# Patient Record
Sex: Female | Born: 1990 | Race: Black or African American | Hispanic: No | Marital: Single | Smoking: Former smoker
Health system: Southern US, Community
[De-identification: ages and names within clinical notes are randomized; demographics above are authoritative.]

## PROBLEM LIST (undated history)

## (undated) ENCOUNTER — Ambulatory Visit: Admission: EM | Payer: BC Managed Care – PPO

## (undated) DIAGNOSIS — D259 Leiomyoma of uterus, unspecified: Secondary | ICD-10-CM

## (undated) DIAGNOSIS — O139 Gestational [pregnancy-induced] hypertension without significant proteinuria, unspecified trimester: Secondary | ICD-10-CM

## (undated) DIAGNOSIS — K2970 Gastritis, unspecified, without bleeding: Secondary | ICD-10-CM

## (undated) DIAGNOSIS — D649 Anemia, unspecified: Secondary | ICD-10-CM

## (undated) DIAGNOSIS — O3410 Maternal care for benign tumor of corpus uteri, unspecified trimester: Secondary | ICD-10-CM

## (undated) DIAGNOSIS — A048 Other specified bacterial intestinal infections: Secondary | ICD-10-CM

## (undated) DIAGNOSIS — K219 Gastro-esophageal reflux disease without esophagitis: Secondary | ICD-10-CM

## (undated) HISTORY — PX: NO PAST SURGERIES: SHX2092

---
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
## 2008-10-29 DIAGNOSIS — O1490 Unspecified pre-eclampsia, unspecified trimester: Secondary | ICD-10-CM

## 2011-09-14 ENCOUNTER — Emergency Department (HOSPITAL_COMMUNITY)
Admission: EM | Admit: 2011-09-14 | Discharge: 2011-09-14 | Disposition: A | Discharge status: Home / Self Care | Payer: Self-pay | Attending: Emergency Medicine | Admitting: Emergency Medicine

## 2011-09-14 ENCOUNTER — Emergency Department (HOSPITAL_COMMUNITY): Payer: Self-pay

## 2011-09-14 ENCOUNTER — Encounter: Payer: Self-pay | Admitting: Emergency Medicine

## 2011-09-14 DIAGNOSIS — R51 Headache: Secondary | ICD-10-CM | POA: Insufficient documentation

## 2011-09-14 DIAGNOSIS — R509 Fever, unspecified: Secondary | ICD-10-CM | POA: Insufficient documentation

## 2011-09-14 DIAGNOSIS — R05 Cough: Secondary | ICD-10-CM | POA: Insufficient documentation

## 2011-09-14 DIAGNOSIS — J069 Acute upper respiratory infection, unspecified: Secondary | ICD-10-CM | POA: Insufficient documentation

## 2011-09-14 DIAGNOSIS — R059 Cough, unspecified: Secondary | ICD-10-CM | POA: Insufficient documentation

## 2011-09-14 MED ORDER — ACETAMINOPHEN 325 MG PO TABS
ORAL_TABLET | ORAL | Status: AC
  Filled 2011-09-14: qty 2

## 2011-09-14 MED ORDER — BENZONATATE 100 MG PO CAPS: 100.0000 mg | ORAL_CAPSULE | Freq: Three times a day (TID) | ORAL | Status: AC

## 2011-09-14 MED ORDER — ACETAMINOPHEN 325 MG PO TABS
ORAL_TABLET | ORAL | Status: AC
  Filled 2011-09-14: qty 1

## 2011-09-14 MED ORDER — AZITHROMYCIN 250 MG PO TABS: 250.0000 mg | ORAL_TABLET | Freq: Every day | ORAL | Status: AC

## 2011-09-14 MED ORDER — ACETAMINOPHEN 325 MG PO TABS
650.0000 mg | ORAL_TABLET | Freq: Four times a day (QID) | ORAL | Status: DC | PRN
  Administered 2011-09-14: 650 mg via ORAL

## 2011-09-14 NOTE — ED Notes (Signed)
Pt reports cough since yesterday but funny feeling in her chest since Monday. Pt has fever in triage.

## 2011-09-14 NOTE — ED Provider Notes (Signed)
History     CSN: [id number] Arrival date & time: 09/14/2011  3:21 PM   None     Chief Complaint  Patient presents with  . Fever  . Cough    (Consider location/radiation/quality/duration/timing/severity/associated sxs/prior treatment) Patient is a 20 y.o. female presenting with fever and cough. The history is provided by the patient.  Fever Primary symptoms of the febrile illness include fever and cough. Primary symptoms do not include fatigue, visual change, headaches, wheezing, shortness of breath, abdominal pain, nausea, vomiting, diarrhea, dysuria, altered mental status, myalgias, arthralgias or rash. The current episode started 6 to 7 days ago. This is a new problem. The problem has not changed since onset. Cough Pertinent negatives include no headaches, no myalgias, no shortness of breath and no wheezing.   An otherwise healthy female presents with cough, fever, and sinus pressure for 1 week. Denies any SOB, chest pain, neck pain, weakness, urionary symptoms.  History reviewed. No pertinent past medical history.  History reviewed. No pertinent past surgical history.  History reviewed. No pertinent family history.  History  Substance Use Topics  . Smoking status: Current Everyday Smoker  . Smokeless tobacco: Never Used  . Alcohol Use: No    OB History    Grav Para Term Preterm Abortions TAB SAB Ect Mult Living                  Review of Systems  Constitutional: Positive for fever. Negative for fatigue.  Respiratory: Positive for cough. Negative for shortness of breath and wheezing.   Gastrointestinal: Negative for nausea, vomiting, abdominal pain and diarrhea.  Genitourinary: Negative for dysuria.  Musculoskeletal: Negative for myalgias and arthralgias.  Skin: Negative for rash.  Neurological: Negative for headaches.  Psychiatric/Behavioral: Negative for altered mental status.  All other systems reviewed and are negative.    Allergies  Review of patient's  allergies indicates no known allergies.  Home Medications   Current Outpatient Rx  Name Route Sig Dispense Refill  . GUAIFENESIN-DM 100-10 MG/5ML PO SYRP Oral Take 10 mLs by mouth 3 (three) times daily as needed. cough       BP 138/80  Pulse 110  Temp(Src) 102.3 F (39.1 C) (Oral)  Resp 18  SpO2 100%  LMP 09/01/2011  Physical Exam  Nursing note and vitals reviewed. Constitutional: She appears well-developed and well-nourished.  HENT:  Head: Normocephalic and atraumatic.  Eyes: Conjunctivae are normal. Pupils are equal, round, and reactive to light.  Neck: Trachea normal, normal range of motion and full passive range of motion without pain. Neck supple.  Cardiovascular: Normal rate, regular rhythm and normal pulses.   Pulmonary/Chest: Effort normal and breath sounds normal. Chest wall is not dull to percussion. She exhibits no tenderness, no crepitus, no edema, no deformity and no retraction.  Abdominal: Soft. Normal appearance and bowel sounds are normal.  Musculoskeletal: Normal range of motion.  Neurological: She is alert. She has normal strength.  Skin: Skin is warm, dry and intact.  Psychiatric: She has a normal mood and affect. Her speech is normal and behavior is normal. Judgment and thought content normal. Cognition and memory are normal.    ED Course  Procedures (including critical care time)  Labs Reviewed - No data to display Dg Chest 2 View  09/14/2011  *RADIOLOGY REPORT*  Clinical Data: Fever and cough  CHEST - 2 VIEW  Comparison: None.  Findings: The cardiac silhouette, mediastinum, pulmonary vasculature are within normal limits.  Both lungs are clear. There is no  acute bony abnormality.  IMPRESSION: There is no evidence of acute cardiac or pulmonary process.  Original Report Authenticated By: Brandon Melnick, M.D.     No diagnosis found.    MDM  Will start patient on antibiotics due to high fever, chest xray does not show a pneumonia. Discussed with  patients, symptoms that should bring her back to the ED.        Dorthula Matas, PA 09/14/11 1840

## 2011-09-14 NOTE — ED Provider Notes (Signed)
Medical screening examination/treatment/procedure(s) were performed by non-physician practitioner and as supervising physician I was immediately available for consultation/collaboration.   Gwyneth Sprout, MD 09/14/11 2140
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
## 2012-08-10 ENCOUNTER — Encounter: Payer: Self-pay | Admitting: Obstetrics & Gynecology

## 2012-08-27 ENCOUNTER — Encounter: Payer: Self-pay | Admitting: Family Medicine

## 2013-02-22 ENCOUNTER — Encounter (HOSPITAL_COMMUNITY): Payer: Self-pay

## 2013-02-22 ENCOUNTER — Emergency Department (HOSPITAL_COMMUNITY)
Admission: EM | Admit: 2013-02-22 | Discharge: 2013-02-22 | Disposition: A | Discharge status: Home / Self Care | Payer: Self-pay | Attending: Emergency Medicine | Admitting: Emergency Medicine

## 2013-02-22 DIAGNOSIS — R42 Dizziness and giddiness: Secondary | ICD-10-CM | POA: Insufficient documentation

## 2013-02-22 DIAGNOSIS — F17200 Nicotine dependence, unspecified, uncomplicated: Secondary | ICD-10-CM | POA: Insufficient documentation

## 2013-02-22 DIAGNOSIS — R209 Unspecified disturbances of skin sensation: Secondary | ICD-10-CM | POA: Insufficient documentation

## 2013-02-22 DIAGNOSIS — R630 Anorexia: Secondary | ICD-10-CM | POA: Insufficient documentation

## 2013-02-22 DIAGNOSIS — R5381 Other malaise: Secondary | ICD-10-CM | POA: Insufficient documentation

## 2013-02-22 DIAGNOSIS — R5383 Other fatigue: Secondary | ICD-10-CM

## 2013-02-22 DIAGNOSIS — Z3202 Encounter for pregnancy test, result negative: Secondary | ICD-10-CM | POA: Insufficient documentation

## 2013-02-22 LAB — URINE MICROSCOPIC-ADD ON

## 2013-02-22 LAB — COMPREHENSIVE METABOLIC PANEL
Albumin: 4.6 g/dL (ref 3.5–5.2)
BUN: 11 mg/dL (ref 6–23)
Calcium: 9.5 mg/dL (ref 8.4–10.5)
Creatinine, Ser: 0.84 mg/dL (ref 0.50–1.10)
GFR calc Af Amer: >90 mL/min (ref >90)
Glucose, Bld: 92 mg/dL (ref 70–99)
Total Protein: 8.2 g/dL (ref 6.0–8.3)

## 2013-02-22 LAB — CBC WITH DIFFERENTIAL/PLATELET
Basophils Absolute: 0 10*3/uL (ref 0.0–0.1)
Eosinophils Relative: 1 % (ref 0–5)
HCT: 41.1 % (ref 36.0–46.0)
Lymphocytes Relative: 42 % (ref 12–46)
Lymphs Abs: 1.9 10*3/uL (ref 0.7–4.0)
MCV: 77.8 fL — ABNORMAL LOW (ref 78.0–100.0)
Monocytes Absolute: 0.3 10*3/uL (ref 0.1–1.0)
Neutro Abs: 2.3 10*3/uL (ref 1.7–7.7)
RBC: 5.28 MIL/uL — ABNORMAL HIGH (ref 3.87–5.11)
WBC: 4.6 10*3/uL (ref 4.0–10.5)

## 2013-02-22 LAB — URINALYSIS, ROUTINE W REFLEX MICROSCOPIC
Bilirubin Urine: NEGATIVE
Glucose, UA: NEGATIVE mg/dL
Ketones, ur: NEGATIVE mg/dL
Nitrite: NEGATIVE
Specific Gravity, Urine: 1.022 (ref 1.005–1.030)
pH: 5 (ref 5.0–8.0)

## 2013-02-22 NOTE — ED Notes (Signed)
Since Friday, poor appetite, light headed, hands having episodes of numbness, pt reports since Friday, no relief with rest, feels over all weakness.

## 2013-02-22 NOTE — ED Provider Notes (Signed)
Patient with diminished appetite onset 3 days ago and also complains of generalized weakness. Denies fever denies headache denies abdominal pain Head she is also had intermittent numbness in both palms. States "I wonder if to my head". On exam patient is alert nontoxic Glasgow Coma Score 15 well-appearing  Doug Sou, MD 02/22/13 [phone]
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
## 2013-02-22 NOTE — ED Provider Notes (Signed)
History     CSN: [id number]  Arrival date & time 02/22/13  6962   First MD Initiated Contact with Patient 02/22/13 0710      Chief Complaint  Patient presents with  . Dizziness  . Anorexia  . Numbness    (Consider location/radiation/quality/duration/timing/severity/associated sxs/prior treatment) HPI Comments: Patient presents to the ED with a chief complaint of fatigue.  She states that she has been feeling very tired and run down since Friday.  She states that she has had a poor appetite, has felt light-headed, and has had intermittent episodes of numbness to the palms. She denies fever, chills, vomiting, diarrhea, dysuria, or vaginal discharge.  She is not in any pain.  Her LMP was 2 days ago.  The history is provided by the patient. No language interpreter was used.    No past medical history on file.  No past surgical history on file.  No family history on file.  History  Substance Use Topics  . Smoking status: Current Every Day Smoker  . Smokeless tobacco: Never Used  . Alcohol Use: No    OB History   Grav Para Term Preterm Abortions TAB SAB Ect Mult Living                  Review of Systems  All other systems reviewed and are negative.    Allergies  Review of patient's allergies indicates no known allergies.  Home Medications   Current Outpatient Rx  Name  Route  Sig  Dispense  Refill  . acetaminophen (TYLENOL) 500 MG tablet   Oral   Take 1,000 mg by mouth every 6 (six) hours as needed for pain.         . naproxen sodium (ANAPROX) 220 MG tablet   Oral   Take 440 mg by mouth daily as needed (for pain).           BP 128/89  Pulse 75  Temp(Src) 98 F (36.7 C) (Oral)  Resp 16  SpO2 100%  Physical Exam  Nursing note and vitals reviewed. Constitutional: She is oriented to person, place, and time. She appears well-developed and well-nourished.  HENT:  Head: Normocephalic and atraumatic.  No neck stiff, no signs of meningismus,   Eyes:  Conjunctivae and EOM are normal. Pupils are equal, round, and reactive to light.  Neck: Normal range of motion. Neck supple.  Cardiovascular: Normal rate, regular rhythm and normal heart sounds.  Exam reveals no gallop and no friction rub.   No murmur heard. Pulmonary/Chest: Effort normal and breath sounds normal. No respiratory distress. She has no wheezes. She has no rales. She exhibits no tenderness.  Abdominal: Soft. Bowel sounds are normal. She exhibits no distension and no mass. There is no tenderness. There is no rebound and no guarding.  Musculoskeletal: Normal range of motion. She exhibits no edema and no tenderness.  Moves all extremities  Neurological: She is alert and oriented to person, place, and time.  CN 3-12 intact, no pronator drift, or ataxia, sensation and strength intact  Skin: Skin is warm and dry.  Psychiatric: She has a normal mood and affect. Her behavior is normal. Judgment and thought content normal.    ED Course  Procedures (including critical care time)  Labs Reviewed  GLUCOSE, CAPILLARY  COMPREHENSIVE METABOLIC PANEL  CBC WITH DIFFERENTIAL  URINALYSIS, ROUTINE W REFLEX MICROSCOPIC   Results for orders placed during the hospital encounter of 02/22/13  GLUCOSE, CAPILLARY      Result Value  Range   Glucose-Capillary 91  70 - 99 mg/dL  COMPREHENSIVE METABOLIC PANEL      Result Value Range   Sodium 139  135 - 145 mEq/L   Potassium 3.6  3.5 - 5.1 mEq/L   Chloride 104  96 - 112 mEq/L   CO2 28  19 - 32 mEq/L   Glucose, Bld 92  70 - 99 mg/dL   BUN 11  6 - 23 mg/dL   Creatinine, Ser 1.61  0.50 - 1.10 mg/dL   Calcium 9.5  8.4 - 09.6 mg/dL   Total Protein 8.2  6.0 - 8.3 g/dL   Albumin 4.6  3.5 - 5.2 g/dL   AST 13  0 - 37 U/L   ALT 16  0 - 35 U/L   Alkaline Phosphatase 47  39 - 117 U/L   Total Bilirubin 0.6  0.3 - 1.2 mg/dL   GFR calc non Af Amer >90  >90 mL/min   GFR calc Af Amer >90  >90 mL/min  CBC WITH DIFFERENTIAL      Result Value Range   WBC 4.6   4.0 - 10.5 K/uL   RBC 5.28 (*) 3.87 - 5.11 MIL/uL   Hemoglobin 14.6  12.0 - 15.0 g/dL   HCT 04.5  40.9 - 81.1 %   MCV 77.8 (*) 78.0 - 100.0 fL   MCH 27.7  26.0 - 34.0 pg   MCHC 35.5  30.0 - 36.0 g/dL   RDW 91.4  78.2 - 95.6 %   Platelets 228  150 - 400 K/uL   Neutrophils Relative 50  43 - 77 %   Neutro Abs 2.3  1.7 - 7.7 K/uL   Lymphocytes Relative 42  12 - 46 %   Lymphs Abs 1.9  0.7 - 4.0 K/uL   Monocytes Relative 6  3 - 12 %   Monocytes Absolute 0.3  0.1 - 1.0 K/uL   Eosinophils Relative 1  0 - 5 %   Eosinophils Absolute 0.1  0.0 - 0.7 K/uL   Basophils Relative 1  0 - 1 %   Basophils Absolute 0.0  0.0 - 0.1 K/uL  URINALYSIS, ROUTINE W REFLEX MICROSCOPIC      Result Value Range   Color, Urine YELLOW  YELLOW   APPearance HAZY (*) CLEAR   Specific Gravity, Urine 1.022  1.005 - 1.030   pH 5.0  5.0 - 8.0   Glucose, UA NEGATIVE  NEGATIVE mg/dL   Hgb urine dipstick NEGATIVE  NEGATIVE   Bilirubin Urine NEGATIVE  NEGATIVE   Ketones, ur NEGATIVE  NEGATIVE mg/dL   Protein, ur NEGATIVE  NEGATIVE mg/dL   Urobilinogen, UA 0.2  0.0 - 1.0 mg/dL   Nitrite NEGATIVE  NEGATIVE   Leukocytes, UA SMALL (*) NEGATIVE  URINE MICROSCOPIC-ADD ON      Result Value Range   Squamous Epithelial / LPF FEW (*) RARE   WBC, UA 0-2  <3 WBC/hpf   RBC / HPF 0-2  <3 RBC/hpf   Bacteria, UA FEW (*) RARE   Urine-Other MUCOUS PRESENT    POCT PREGNANCY, URINE      Result Value Range   Preg Test, Ur NEGATIVE  NEGATIVE      1. Anorexia   2. Fatigue       MDM  Patient complaining of fatigue. Will check basic labs. Check urine and urine pregnancy. Will reevaluate. Patient is neurovascularly intact.  Will check calcium based on tingling of hands.    Labs are reassuring.  Patient seen by and discussed with Dr. Ethelda Chick, who agrees that the patient can be discharged to home.  The patient states that "this might just be in my head."    I told her to continue eating, and to follow-up with her  PCP.       Roxy Horseman, PA-C 02/22/13 1657

## 2013-02-23 NOTE — ED Provider Notes (Signed)
Medical screening examination/treatment/procedure(s) were conducted as a shared visit with non-physician practitioner(s) and myself.  I personally evaluated the patient during the encounter  Doug Sou, MD 02/23/13 [phone]

## 2013-07-06 ENCOUNTER — Encounter (HOSPITAL_COMMUNITY): Payer: Self-pay | Admitting: Emergency Medicine

## 2013-07-06 ENCOUNTER — Emergency Department (HOSPITAL_COMMUNITY)
Admission: EM | Admit: 2013-07-06 | Discharge: 2013-07-06 | Disposition: A | Discharge status: Home / Self Care | Payer: Self-pay | Attending: Emergency Medicine | Admitting: Emergency Medicine

## 2013-07-06 ENCOUNTER — Emergency Department (HOSPITAL_COMMUNITY): Payer: Self-pay

## 2013-07-06 DIAGNOSIS — F17200 Nicotine dependence, unspecified, uncomplicated: Secondary | ICD-10-CM | POA: Insufficient documentation

## 2013-07-06 DIAGNOSIS — S60222A Contusion of left hand, initial encounter: Secondary | ICD-10-CM

## 2013-07-06 DIAGNOSIS — S60229A Contusion of unspecified hand, initial encounter: Secondary | ICD-10-CM | POA: Insufficient documentation
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
## 2013-07-06 NOTE — ED Notes (Signed)
Pt discharged.Vital signs stable and GCS 15 
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
## 2013-07-07 NOTE — ED Provider Notes (Signed)
Medical screening examination/treatment/procedure(s) were performed by non-physician practitioner and as supervising physician I was immediately available for consultation/collaboration.  Armond Cuthrell R. Maryjo Ragon, MD 07/07/13 0018 

## 2013-09-22 ENCOUNTER — Encounter (HOSPITAL_COMMUNITY): Payer: Self-pay | Admitting: Emergency Medicine

## 2013-09-22 ENCOUNTER — Emergency Department (HOSPITAL_COMMUNITY)
Admission: EM | Admit: 2013-09-22 | Discharge: 2013-09-23 | Disposition: A | Discharge status: Home / Self Care | Payer: Self-pay | Attending: Emergency Medicine | Admitting: Emergency Medicine

## 2013-09-22 DIAGNOSIS — N898 Other specified noninflammatory disorders of vagina: Secondary | ICD-10-CM | POA: Insufficient documentation

## 2013-09-22 DIAGNOSIS — N939 Abnormal uterine and vaginal bleeding, unspecified: Secondary | ICD-10-CM

## 2013-09-22 DIAGNOSIS — E876 Hypokalemia: Secondary | ICD-10-CM | POA: Insufficient documentation

## 2013-09-22 DIAGNOSIS — F17200 Nicotine dependence, unspecified, uncomplicated: Secondary | ICD-10-CM | POA: Insufficient documentation

## 2013-09-22 DIAGNOSIS — N390 Urinary tract infection, site not specified: Secondary | ICD-10-CM | POA: Insufficient documentation

## 2013-09-22 DIAGNOSIS — Z3202 Encounter for pregnancy test, result negative: Secondary | ICD-10-CM | POA: Insufficient documentation

## 2013-09-22 LAB — POCT PREGNANCY, URINE: Preg Test, Ur: NEGATIVE

## 2013-09-22 NOTE — ED Notes (Signed)
Pt. reports vaginal bleeding with clots onset yesterday with mild low abdominal cramping .

## 2013-09-23 LAB — CBC WITH DIFFERENTIAL/PLATELET
Basophils Absolute: 0 10*3/uL (ref 0.0–0.1)
Basophils Relative: 1 % (ref 0–1)
Eosinophils Absolute: 0.1 10*3/uL (ref 0.0–0.7)
Eosinophils Relative: 2 % (ref 0–5)
HCT: 36.4 % (ref 36.0–46.0)
Hemoglobin: 12.6 g/dL (ref 12.0–15.0)
MCH: 27.6 pg (ref 26.0–34.0)
MCHC: 34.6 g/dL (ref 30.0–36.0)
MCV: 79.6 fL (ref 78.0–100.0)
Monocytes Absolute: 0.3 10*3/uL (ref 0.1–1.0)
Monocytes Relative: 5 % (ref 3–12)
Neutro Abs: 3.5 10*3/uL (ref 1.7–7.7)
RDW: 13.6 % (ref 11.5–15.5)

## 2013-09-23 LAB — URINALYSIS, ROUTINE W REFLEX MICROSCOPIC
Bilirubin Urine: NEGATIVE
Glucose, UA: NEGATIVE mg/dL
Ketones, ur: NEGATIVE mg/dL
Protein, ur: 30 mg/dL — AB
pH: 5 (ref 5.0–8.0)

## 2013-09-23 LAB — COMPREHENSIVE METABOLIC PANEL
AST: 12 U/L (ref 0–37)
Albumin: 4.2 g/dL (ref 3.5–5.2)
BUN: 8 mg/dL (ref 6–23)
Calcium: 9 mg/dL (ref 8.4–10.5)
Chloride: 100 mEq/L (ref 96–112)
Creatinine, Ser: 0.73 mg/dL (ref 0.50–1.10)
Total Bilirubin: 0.4 mg/dL (ref 0.3–1.2)

## 2013-09-23 LAB — URINE MICROSCOPIC-ADD ON

## 2013-09-23 LAB — GC/CHLAMYDIA PROBE AMP: GC Probe RNA: NEGATIVE

## 2013-09-23 LAB — WET PREP, GENITAL: WBC, Wet Prep HPF POC: NONE SEEN

## 2013-09-23 MED ORDER — POTASSIUM CHLORIDE CRYS ER 20 MEQ PO TBCR
40.0000 meq | EXTENDED_RELEASE_TABLET | Freq: Once | ORAL | Status: AC
  Administered 2013-09-23: 40 meq via ORAL
  Filled 2013-09-23: qty 2

## 2013-09-23 MED ORDER — CEPHALEXIN 500 MG PO CAPS: 500.0000 mg | ORAL_CAPSULE | Freq: Two times a day (BID) | ORAL | Status: DC

## 2013-09-23 NOTE — ED Provider Notes (Signed)
CSN: [id number]     Arrival date & time 09/22/13  2108 History   None    Chief Complaint  Patient presents with  . Vaginal Bleeding    HPI  Colleen Page is a 22 y.o. female with no PMH who presents to the ED for evaluation of vaginal bleeding.  History was provided by the patient.  Patient states that she developed dark brown vaginal spotting yesterday.  Today, she developed bright red blood with small blood clots about a quarter sized.  She states she went to the bathroom today and passed a large blood clot, which is why she became concerned and came to the ED.  Patient brought the blood clot to the ED to be examined.  Patient denies any significant bleeding or hemorrhage.  She denies any lightheadedness/dizziness.  She denies any vaginal trauma or dyspareunia.  She states she had a menstrual period about 3 weeks ago and her bleeding today is a little early for her.  She denies being on any oral contraceptions/birth control.  She is currently sexually active with a new sexual partner.  She denies any vaginal discharge or genital sores.  She denies any abdominal pain, dysuria, hematuria, back pain, pelvic pain, nausea, emesis, diarrhea, constipation, or fevers.  Patient states that she was feeling sick last week with URI-like symptoms.  She states her appetite and intake was poor compared to usual.       History reviewed. No pertinent past medical history. History reviewed. No pertinent past surgical history. No family history on file. History  Substance Use Topics  . Smoking status: Current Every Day Smoker  . Smokeless tobacco: Never Used  . Alcohol Use: No   OB History   Grav Para Term Preterm Abortions TAB SAB Ect Mult Living                 Review of Systems  Constitutional: Negative for fever, chills, diaphoresis, activity change, appetite change and fatigue.  HENT: Negative for sore throat.   Respiratory: Negative for cough and shortness of breath.   Cardiovascular: Negative  for chest pain.  Gastrointestinal: Negative for nausea, vomiting, abdominal pain, diarrhea and constipation.  Genitourinary: Positive for vaginal bleeding and menstrual problem. Negative for dysuria, urgency, frequency, hematuria, flank pain, decreased urine volume, vaginal discharge, difficulty urinating, genital sores, vaginal pain, pelvic pain and dyspareunia.  Musculoskeletal: Negative for back pain.  Skin: Negative for wound.  Neurological: Negative for dizziness, syncope, weakness and light-headedness.    Allergies  Review of patient's allergies indicates no known allergies.  Home Medications   Current Outpatient Rx  Name  Route  Sig  Dispense  Refill  . Aspirin-Acetaminophen-Caffeine (GOODY HEADACHE PO)   Oral   Take 1 Package by mouth daily as needed (for headaches).          BP 136/83  Pulse 82  Temp(Src) 97.4 F (36.3 C) (Oral)  Resp 16  Ht 5\' 7"  (1.702 m)  Wt 118 lb 4.8 oz (53.661 kg)  BMI 18.52 kg/m2  SpO2 100%  LMP 08/31/2013  Filed Vitals:   09/22/13 2139 09/23/13 0327  BP: 136/83 139/61  Pulse: 82 68  Temp: 97.4 F (36.3 C)   TempSrc: Oral   Resp: 16 18  Height: 5\' 7"  (1.702 m)   Weight: 118 lb 4.8 oz (53.661 kg)   SpO2: 100% 100%    Physical Exam  Nursing note and vitals reviewed. Constitutional: She is oriented to person, place, and time. She appears well-developed  and well-nourished. No distress.  HENT:  Head: Normocephalic and atraumatic.  Right Ear: External ear normal.  Left Ear: External ear normal.  Mouth/Throat: Oropharynx is clear and moist.  Eyes: Conjunctivae are normal. Right eye exhibits no discharge. Left eye exhibits no discharge.  Neck: Normal range of motion. Neck supple.  Cardiovascular: Normal rate, regular rhythm, normal heart sounds and intact distal pulses.  Exam reveals no gallop and no friction rub.   No murmur heard. Pulmonary/Chest: Effort normal and breath sounds normal. No respiratory distress. She has no wheezes.  She has no rales. She exhibits no tenderness.  Abdominal: Soft. Bowel sounds are normal. She exhibits no distension and no mass. There is no tenderness. There is no rebound and no guarding.  Musculoskeletal: Normal range of motion. She exhibits no edema and no tenderness.  No lumbar or CVA tenderness. Patient able to ambulate without difficulty or ataxia.    Neurological: She is alert and oriented to person, place, and time.  Skin: Skin is warm and dry. She is not diaphoretic.    ED Course  Procedures (including critical care time) Labs Review Labs Reviewed  URINALYSIS, ROUTINE W REFLEX MICROSCOPIC - Abnormal; Notable for the following:    APPearance CLOUDY (*)    Hgb urine dipstick LARGE (*)    Protein, ur 30 (*)    Leukocytes, UA MODERATE (*)    All other components within normal limits  COMPREHENSIVE METABOLIC PANEL - Abnormal; Notable for the following:    Potassium 3.0 (*)    All other components within normal limits  URINE MICROSCOPIC-ADD ON - Abnormal; Notable for the following:    Bacteria, UA MANY (*)    All other components within normal limits  WET PREP, GENITAL  GC/CHLAMYDIA PROBE AMP  CBC WITH DIFFERENTIAL  POCT PREGNANCY, URINE   Imaging Review No results found.  EKG Interpretation   None      Results for orders placed during the hospital encounter of 09/22/13  WET PREP, GENITAL      Result Value Range   Yeast Wet Prep HPF POC NONE SEEN  NONE SEEN   Trich, Wet Prep NONE SEEN  NONE SEEN   Clue Cells Wet Prep HPF POC FEW (*) NONE SEEN   WBC, Wet Prep HPF POC NONE SEEN  NONE SEEN  GC/CHLAMYDIA PROBE AMP      Result Value Range   CT Probe RNA NEGATIVE  NEGATIVE   GC Probe RNA NEGATIVE  NEGATIVE  URINALYSIS, ROUTINE W REFLEX MICROSCOPIC      Result Value Range   Color, Urine YELLOW  YELLOW   APPearance CLOUDY (*) CLEAR   Specific Gravity, Urine 1.017  1.005 - 1.030   pH 5.0  5.0 - 8.0   Glucose, UA NEGATIVE  NEGATIVE mg/dL   Hgb urine dipstick LARGE (*)  NEGATIVE   Bilirubin Urine NEGATIVE  NEGATIVE   Ketones, ur NEGATIVE  NEGATIVE mg/dL   Protein, ur 30 (*) NEGATIVE mg/dL   Urobilinogen, UA 0.2  0.0 - 1.0 mg/dL   Nitrite NEGATIVE  NEGATIVE   Leukocytes, UA MODERATE (*) NEGATIVE  CBC WITH DIFFERENTIAL      Result Value Range   WBC 5.9  4.0 - 10.5 K/uL   RBC 4.57  3.87 - 5.11 MIL/uL   Hemoglobin 12.6  12.0 - 15.0 g/dL   HCT 16.1  09.6 - 04.5 %   MCV 79.6  78.0 - 100.0 fL   MCH 27.6  26.0 - 34.0 pg  MCHC 34.6  30.0 - 36.0 g/dL   RDW 16.1  09.6 - 04.5 %   Platelets 228  150 - 400 K/uL   Neutrophils Relative % 59  43 - 77 %   Neutro Abs 3.5  1.7 - 7.7 K/uL   Lymphocytes Relative 33  12 - 46 %   Lymphs Abs 1.9  0.7 - 4.0 K/uL   Monocytes Relative 5  3 - 12 %   Monocytes Absolute 0.3  0.1 - 1.0 K/uL   Eosinophils Relative 2  0 - 5 %   Eosinophils Absolute 0.1  0.0 - 0.7 K/uL   Basophils Relative 1  0 - 1 %   Basophils Absolute 0.0  0.0 - 0.1 K/uL  COMPREHENSIVE METABOLIC PANEL      Result Value Range   Sodium 139  135 - 145 mEq/L   Potassium 3.0 (*) 3.5 - 5.1 mEq/L   Chloride 100  96 - 112 mEq/L   CO2 28  19 - 32 mEq/L   Glucose, Bld 80  70 - 99 mg/dL   BUN 8  6 - 23 mg/dL   Creatinine, Ser 4.09  0.50 - 1.10 mg/dL   Calcium 9.0  8.4 - 81.1 mg/dL   Total Protein 7.9  6.0 - 8.3 g/dL   Albumin 4.2  3.5 - 5.2 g/dL   AST 12  0 - 37 U/L   ALT 14  0 - 35 U/L   Alkaline Phosphatase 47  39 - 117 U/L   Total Bilirubin 0.4  0.3 - 1.2 mg/dL   GFR calc non Af Amer >90  >90 mL/min   GFR calc Af Amer >90  >90 mL/min  URINE MICROSCOPIC-ADD ON      Result Value Range   Squamous Epithelial / LPF RARE  RARE   WBC, UA 11-20  <3 WBC/hpf   RBC / HPF 11-20  <3 RBC/hpf   Bacteria, UA MANY (*) RARE   Urine-Other MUCOUS PRESENT    POCT PREGNANCY, URINE      Result Value Range   Preg Test, Ur NEGATIVE  NEGATIVE    MDM   Leatta Alewine is a 22 y.o. female with no PMH who presents to the ED for evaluation of vaginal bleeding.  Rechecks   2:30 AM = Pelvic exam at bedside.  Minimal vaginal bleeding.  No CMT or adnexal tenderness.  No blood clots or vaginal discharge.  No trauma to the vaginal vault/cervix.   3:00 AM = Patient informed of results.  Patient dressed.  No concerns/pain.     Patient evaluated in the ED for vaginal bleeding.  She was found to have a UTI.  She will be treated with Keflex.  No evidence of vaginal trauma.  H&H WNL.  Patient non-toxic and afebrile.  No complaints of abdominal/pelvic pain.  Abdominal exam benign.  Patient having irregular menses.  She may require birth control in the future to help regulate her monthly cycle.  Patient given referral to women's health.  She was also found to have mild hypokalemia and was given K-Dur in the ED.  She was instructed to eat a diet rich in potassium.  Etiology of hypokalemia possibly due to poor diet from recent illness.  Return precautions were given.    Discharge Medication List as of 09/23/2013  3:12 AM    START taking these medications   Details  cephALEXin (KEFLEX) 500 MG capsule Take 1 capsule (500 mg total) by mouth 2 (two) times daily.,  Starting 09/23/2013, Until Discontinued, Print        Final impressions: 1. Vaginal bleeding   2. UTI (urinary tract infection)   3. Hypokalemia      Greer Ee Rydan Gulyas PA-C    Jillyn Ledger, PA-C 09/23/13 1654

## 2013-09-24 NOTE — ED Provider Notes (Signed)
Medical screening examination/treatment/procedure(s) were performed by non-physician practitioner and as supervising physician I was immediately available for consultation/collaboration.  EKG Interpretation   None        Tondalaya Perren K Maddax Palinkas-Rasch, MD 09/24/13 2338

## 2019-02-26 DIAGNOSIS — R1030 Lower abdominal pain, unspecified: Secondary | ICD-10-CM | POA: Diagnosis not present

## 2019-02-26 DIAGNOSIS — K5909 Other constipation: Secondary | ICD-10-CM | POA: Diagnosis not present

## 2019-02-26 DIAGNOSIS — R140 Abdominal distension (gaseous): Secondary | ICD-10-CM | POA: Diagnosis not present

## 2019-06-07 DIAGNOSIS — Z0000 Encounter for general adult medical examination without abnormal findings: Secondary | ICD-10-CM | POA: Diagnosis not present

## 2019-06-07 DIAGNOSIS — R030 Elevated blood-pressure reading, without diagnosis of hypertension: Secondary | ICD-10-CM | POA: Diagnosis not present

## 2019-06-07 DIAGNOSIS — Z23 Encounter for immunization: Secondary | ICD-10-CM | POA: Diagnosis not present

## 2019-06-07 DIAGNOSIS — R7309 Other abnormal glucose: Secondary | ICD-10-CM | POA: Diagnosis not present

## 2019-09-01 DIAGNOSIS — Z20828 Contact with and (suspected) exposure to other viral communicable diseases: Secondary | ICD-10-CM | POA: Diagnosis not present

## 2019-10-05 DIAGNOSIS — R399 Unspecified symptoms and signs involving the genitourinary system: Secondary | ICD-10-CM | POA: Diagnosis not present

## 2019-11-22 DIAGNOSIS — F4321 Adjustment disorder with depressed mood: Secondary | ICD-10-CM | POA: Diagnosis not present

## 2019-11-22 DIAGNOSIS — F419 Anxiety disorder, unspecified: Secondary | ICD-10-CM | POA: Diagnosis not present

## 2019-12-13 DIAGNOSIS — F419 Anxiety disorder, unspecified: Secondary | ICD-10-CM | POA: Diagnosis not present

## 2019-12-23 DIAGNOSIS — F331 Major depressive disorder, recurrent, moderate: Secondary | ICD-10-CM | POA: Diagnosis not present

## 2019-12-23 DIAGNOSIS — F411 Generalized anxiety disorder: Secondary | ICD-10-CM | POA: Diagnosis not present

## 2019-12-23 DIAGNOSIS — F4323 Adjustment disorder with mixed anxiety and depressed mood: Secondary | ICD-10-CM | POA: Diagnosis not present

## 2020-01-06 DIAGNOSIS — F331 Major depressive disorder, recurrent, moderate: Secondary | ICD-10-CM | POA: Diagnosis not present

## 2020-01-06 DIAGNOSIS — F4323 Adjustment disorder with mixed anxiety and depressed mood: Secondary | ICD-10-CM | POA: Diagnosis not present

## 2020-01-06 DIAGNOSIS — F411 Generalized anxiety disorder: Secondary | ICD-10-CM | POA: Diagnosis not present

## 2020-01-10 DIAGNOSIS — F331 Major depressive disorder, recurrent, moderate: Secondary | ICD-10-CM | POA: Diagnosis not present

## 2020-01-10 DIAGNOSIS — F411 Generalized anxiety disorder: Secondary | ICD-10-CM | POA: Diagnosis not present

## 2020-01-10 DIAGNOSIS — F4323 Adjustment disorder with mixed anxiety and depressed mood: Secondary | ICD-10-CM | POA: Diagnosis not present

## 2020-01-20 DIAGNOSIS — F331 Major depressive disorder, recurrent, moderate: Secondary | ICD-10-CM | POA: Diagnosis not present

## 2020-01-20 DIAGNOSIS — F411 Generalized anxiety disorder: Secondary | ICD-10-CM | POA: Diagnosis not present

## 2020-02-01 DIAGNOSIS — F411 Generalized anxiety disorder: Secondary | ICD-10-CM | POA: Diagnosis not present

## 2020-02-01 DIAGNOSIS — F331 Major depressive disorder, recurrent, moderate: Secondary | ICD-10-CM | POA: Diagnosis not present

## 2020-02-24 DIAGNOSIS — F411 Generalized anxiety disorder: Secondary | ICD-10-CM | POA: Diagnosis not present

## 2020-02-24 DIAGNOSIS — F331 Major depressive disorder, recurrent, moderate: Secondary | ICD-10-CM | POA: Diagnosis not present

## 2020-03-01 DIAGNOSIS — F331 Major depressive disorder, recurrent, moderate: Secondary | ICD-10-CM | POA: Diagnosis not present

## 2020-03-01 DIAGNOSIS — F411 Generalized anxiety disorder: Secondary | ICD-10-CM | POA: Diagnosis not present

## 2020-03-16 DIAGNOSIS — Z113 Encounter for screening for infections with a predominantly sexual mode of transmission: Secondary | ICD-10-CM | POA: Diagnosis not present

## 2020-03-16 DIAGNOSIS — N921 Excessive and frequent menstruation with irregular cycle: Secondary | ICD-10-CM | POA: Diagnosis not present

## 2020-03-24 DIAGNOSIS — Z20822 Contact with and (suspected) exposure to covid-19: Secondary | ICD-10-CM | POA: Diagnosis not present

## 2020-03-28 DIAGNOSIS — J0110 Acute frontal sinusitis, unspecified: Secondary | ICD-10-CM | POA: Diagnosis not present

## 2020-04-06 DIAGNOSIS — F331 Major depressive disorder, recurrent, moderate: Secondary | ICD-10-CM | POA: Diagnosis not present

## 2020-04-06 DIAGNOSIS — F411 Generalized anxiety disorder: Secondary | ICD-10-CM | POA: Diagnosis not present

## 2020-04-27 DIAGNOSIS — F411 Generalized anxiety disorder: Secondary | ICD-10-CM | POA: Diagnosis not present

## 2020-04-27 DIAGNOSIS — F331 Major depressive disorder, recurrent, moderate: Secondary | ICD-10-CM | POA: Diagnosis not present

## 2020-05-03 DIAGNOSIS — F331 Major depressive disorder, recurrent, moderate: Secondary | ICD-10-CM | POA: Diagnosis not present

## 2020-05-03 DIAGNOSIS — F411 Generalized anxiety disorder: Secondary | ICD-10-CM | POA: Diagnosis not present

## 2020-06-01 DIAGNOSIS — F411 Generalized anxiety disorder: Secondary | ICD-10-CM | POA: Diagnosis not present

## 2020-06-01 DIAGNOSIS — F331 Major depressive disorder, recurrent, moderate: Secondary | ICD-10-CM | POA: Diagnosis not present

## 2020-06-07 DIAGNOSIS — F411 Generalized anxiety disorder: Secondary | ICD-10-CM | POA: Diagnosis not present

## 2020-06-07 DIAGNOSIS — F331 Major depressive disorder, recurrent, moderate: Secondary | ICD-10-CM | POA: Diagnosis not present

## 2020-06-12 DIAGNOSIS — Z0000 Encounter for general adult medical examination without abnormal findings: Secondary | ICD-10-CM | POA: Diagnosis not present

## 2020-06-13 DIAGNOSIS — S60012A Contusion of left thumb without damage to nail, initial encounter: Secondary | ICD-10-CM | POA: Diagnosis not present

## 2020-06-13 DIAGNOSIS — M67431 Ganglion, right wrist: Secondary | ICD-10-CM | POA: Diagnosis not present

## 2020-06-20 DIAGNOSIS — F411 Generalized anxiety disorder: Secondary | ICD-10-CM | POA: Diagnosis not present

## 2020-06-20 DIAGNOSIS — F331 Major depressive disorder, recurrent, moderate: Secondary | ICD-10-CM | POA: Diagnosis not present

## 2020-06-25 DIAGNOSIS — S46811A Strain of other muscles, fascia and tendons at shoulder and upper arm level, right arm, initial encounter: Secondary | ICD-10-CM | POA: Diagnosis not present

## 2020-07-06 DIAGNOSIS — F331 Major depressive disorder, recurrent, moderate: Secondary | ICD-10-CM | POA: Diagnosis not present

## 2020-07-06 DIAGNOSIS — F411 Generalized anxiety disorder: Secondary | ICD-10-CM | POA: Diagnosis not present

## 2020-09-12 DIAGNOSIS — F411 Generalized anxiety disorder: Secondary | ICD-10-CM | POA: Diagnosis not present

## 2020-10-21 DIAGNOSIS — R059 Cough, unspecified: Secondary | ICD-10-CM | POA: Diagnosis not present

## 2020-10-21 DIAGNOSIS — R079 Chest pain, unspecified: Secondary | ICD-10-CM | POA: Diagnosis not present

## 2020-10-21 DIAGNOSIS — M940 Chondrocostal junction syndrome [Tietze]: Secondary | ICD-10-CM | POA: Diagnosis not present

## 2020-10-21 DIAGNOSIS — Z8616 Personal history of COVID-19: Secondary | ICD-10-CM | POA: Diagnosis not present

## 2020-11-09 DIAGNOSIS — F411 Generalized anxiety disorder: Secondary | ICD-10-CM | POA: Diagnosis not present

## 2020-11-09 DIAGNOSIS — F331 Major depressive disorder, recurrent, moderate: Secondary | ICD-10-CM | POA: Diagnosis not present

## 2020-11-15 DIAGNOSIS — F411 Generalized anxiety disorder: Secondary | ICD-10-CM | POA: Diagnosis not present

## 2020-11-15 DIAGNOSIS — F331 Major depressive disorder, recurrent, moderate: Secondary | ICD-10-CM | POA: Diagnosis not present

## 2020-11-30 DIAGNOSIS — K219 Gastro-esophageal reflux disease without esophagitis: Secondary | ICD-10-CM | POA: Diagnosis not present

## 2020-12-31 DIAGNOSIS — R112 Nausea with vomiting, unspecified: Secondary | ICD-10-CM | POA: Diagnosis not present

## 2021-01-15 DIAGNOSIS — F411 Generalized anxiety disorder: Secondary | ICD-10-CM | POA: Diagnosis not present

## 2021-01-15 DIAGNOSIS — F331 Major depressive disorder, recurrent, moderate: Secondary | ICD-10-CM | POA: Diagnosis not present

## 2021-02-27 DIAGNOSIS — F331 Major depressive disorder, recurrent, moderate: Secondary | ICD-10-CM | POA: Diagnosis not present

## 2021-02-27 DIAGNOSIS — F411 Generalized anxiety disorder: Secondary | ICD-10-CM | POA: Diagnosis not present

## 2021-04-12 DIAGNOSIS — K5900 Constipation, unspecified: Secondary | ICD-10-CM | POA: Diagnosis not present

## 2021-04-12 DIAGNOSIS — R1013 Epigastric pain: Secondary | ICD-10-CM | POA: Diagnosis not present

## 2021-04-26 DIAGNOSIS — R109 Unspecified abdominal pain: Secondary | ICD-10-CM | POA: Diagnosis not present

## 2021-04-26 DIAGNOSIS — R197 Diarrhea, unspecified: Secondary | ICD-10-CM | POA: Diagnosis not present

## 2021-05-02 DIAGNOSIS — R1319 Other dysphagia: Secondary | ICD-10-CM | POA: Diagnosis not present

## 2021-05-02 DIAGNOSIS — R198 Other specified symptoms and signs involving the digestive system and abdomen: Secondary | ICD-10-CM | POA: Diagnosis not present

## 2021-05-02 DIAGNOSIS — K219 Gastro-esophageal reflux disease without esophagitis: Secondary | ICD-10-CM | POA: Diagnosis not present

## 2021-05-09 ENCOUNTER — Other Ambulatory Visit: Payer: Self-pay

## 2021-05-09 ENCOUNTER — Emergency Department (HOSPITAL_COMMUNITY)
Admission: EM | Admit: 2021-05-09 | Discharge: 2021-05-09 | Disposition: A | Discharge status: Home / Self Care | Payer: BC Managed Care – PPO | Attending: Emergency Medicine | Admitting: Emergency Medicine

## 2021-05-09 ENCOUNTER — Encounter (HOSPITAL_COMMUNITY): Payer: Self-pay

## 2021-05-09 DIAGNOSIS — J069 Acute upper respiratory infection, unspecified: Secondary | ICD-10-CM | POA: Diagnosis not present

## 2021-05-09 DIAGNOSIS — U071 COVID-19: Secondary | ICD-10-CM | POA: Insufficient documentation

## 2021-05-09 DIAGNOSIS — F17200 Nicotine dependence, unspecified, uncomplicated: Secondary | ICD-10-CM | POA: Insufficient documentation

## 2021-05-09 DIAGNOSIS — R509 Fever, unspecified: Secondary | ICD-10-CM | POA: Diagnosis not present

## 2021-05-09 DIAGNOSIS — R9431 Abnormal electrocardiogram [ECG] [EKG]: Secondary | ICD-10-CM | POA: Diagnosis not present

## 2021-05-09 LAB — RESP PANEL BY RT-PCR (FLU A&B, COVID) ARPGX2
Influenza A by PCR: NEGATIVE
Influenza B by PCR: NEGATIVE
SARS Coronavirus 2 by RT PCR: POSITIVE — AB

## 2021-05-09 MED ORDER — HYDROCODONE-ACETAMINOPHEN 5-325 MG PO TABS: 1.0000 | ORAL_TABLET | Freq: Four times a day (QID) | ORAL | 0 refills | Status: DC | PRN

## 2021-05-09 NOTE — Discharge Instructions (Addendum)
Your COVID test is still pending.  If it comes back positive you will require 5 days of quarantine and then 5 more days of isolation using a mask.

## 2021-05-09 NOTE — ED Triage Notes (Signed)
Pt complains of generalized body aches and fever for a couple of days. Pt reports that she had a headache yesterday. No fever upon arrival, pt states that she took medication prior to coming in.

## 2021-05-10 NOTE — ED Provider Notes (Signed)
[facility] DEPT Provider Note   CSN: [id number] Arrival date & time: 05/09/21  0542     History Chief Complaint  Patient presents with   Generalized Body Aches   Fever    Colleen Page is a 30 y.o. female.   Fever Associated symptoms: cough, headaches and myalgias   Associated symptoms: no congestion, no dysuria and no rash   Patient with URI symptoms.  Has had for last few days.  Cough.  Dull headache.  Took some symptomatic relief at home.  No definite sick contacts.  Slightly sore throat.  Otherwise healthy.  Only mild production with cough.  No abdominal pain.  No nausea or vomiting.    History reviewed. No pertinent past medical history.  There are no problems to display for this patient.   History reviewed. No pertinent surgical history.   OB History   No obstetric history on file.     History reviewed. No pertinent family history.  Social History   Tobacco Use   Smoking status: Every Day   Smokeless tobacco: Never  Substance Use Topics   Alcohol use: No   Drug use: No    Home Medications Prior to Admission medications   Medication Sig Start Date End Date Taking? Authorizing Provider  HYDROcodone-acetaminophen (NORCO/VICODIN) 5-325 MG tablet Take 1-2 tablets by mouth every 6 (six) hours as needed. 05/09/21  Yes Davonna Belling, MD  Aspirin-Acetaminophen-Caffeine (GOODY HEADACHE PO) Take 1 Package by mouth daily as needed (for headaches).    [provider]  cephALEXin (KEFLEX) 500 MG capsule Take 1 capsule (500 mg total) by mouth 2 (two) times daily. 09/23/13   Lucila Maine, PA-C    Allergies    Patient has no known allergies.  Review of Systems   Review of Systems  Constitutional:  Positive for fever.  HENT:  Negative for congestion.   Respiratory:  Positive for cough.   Gastrointestinal:  Negative for abdominal pain.  Endocrine: Negative for polyuria.  Genitourinary:  Negative for dysuria.   Musculoskeletal:  Positive for myalgias.  Skin:  Negative for rash.  Neurological:  Positive for headaches.   Physical Exam Updated Vital Signs BP 127/86 (BP Location: Left Arm)   Pulse 99   Temp 98.9 F (37.2 C) (Oral)   Resp 16   Ht '5\' 7"'$  (1.702 m)   Wt 69.9 kg   SpO2 100%   BMI 24.12 kg/m   Physical Exam Vitals and nursing note reviewed.  HENT:     Head: Normocephalic.     Mouth/Throat:     Pharynx: Posterior oropharyngeal erythema present. No oropharyngeal exudate.  Eyes:     Extraocular Movements: Extraocular movements intact.  Cardiovascular:     Rate and Rhythm: Normal rate and regular rhythm.  Pulmonary:     Effort: Pulmonary effort is normal.  Abdominal:     Tenderness: There is no abdominal tenderness.  Skin:    Capillary Refill: Capillary refill takes less than 2 seconds.     Coloration: Skin is not jaundiced.  Neurological:     Mental Status: She is alert and oriented to person, place, and time.    ED Results / Procedures / Treatments   Labs (all labs ordered are listed, but only abnormal results are displayed) Labs Reviewed  RESP PANEL BY RT-PCR (FLU A&B, COVID) ARPGX2 - Abnormal; Notable for the following components:      Result Value   SARS Coronavirus 2 by RT PCR POSITIVE (*)  All other components within normal limits    EKG EKG Interpretation  Date/Time:  Wednesday May 09 2021 08:42:18 EDT Ventricular Rate:  93 PR Interval:  149 QRS Duration: 78 QT Interval:  335 QTC Calculation: 417 R Axis:   87 Text Interpretation: Sinus rhythm Biatrial enlargement Confirmed by Randal Buba, April ([id number]) on 05/10/2021 9:27:12 AM  Radiology No results found.  Procedures Procedures   Medications Ordered in ED Medications - No data to display  ED Course  I have reviewed the triage vital signs and the nursing notes.  Pertinent labs & imaging results that were available during my care of the patient were reviewed by me and considered in my medical  decision making (see chart for details).    MDM Rules/Calculators/A&P                           Patient with URI.  Symptoms for last couple days.  Overall low risk.  Well-appearing.  Discharge home.  COVID test still pending at time of discharge but positive later.  Doubt pneumonia.  Lungs clear. Final Clinical Impression(s) / ED Diagnoses Final diagnoses:  Upper respiratory tract infection, unspecified type    Rx / DC Orders ED Discharge Orders          Ordered    HYDROcodone-acetaminophen (NORCO/VICODIN) 5-325 MG tablet  Every 6 hours PRN        05/09/21 0859             Davonna Belling, MD 05/10/21 1038

## 2021-05-29 ENCOUNTER — Other Ambulatory Visit: Payer: Self-pay | Admitting: Physician Assistant

## 2021-05-29 ENCOUNTER — Other Ambulatory Visit: Payer: Self-pay

## 2021-05-29 ENCOUNTER — Ambulatory Visit
Admission: RE | Admit: 2021-05-29 | Discharge: 2021-05-29 | Disposition: A | Discharge status: Home / Self Care | Payer: BC Managed Care – PPO | Source: Ambulatory Visit | Attending: Physician Assistant | Admitting: Physician Assistant

## 2021-05-29 DIAGNOSIS — R198 Other specified symptoms and signs involving the digestive system and abdomen: Secondary | ICD-10-CM

## 2021-05-29 DIAGNOSIS — R109 Unspecified abdominal pain: Secondary | ICD-10-CM | POA: Diagnosis not present

## 2021-05-29 DIAGNOSIS — R1032 Left lower quadrant pain: Secondary | ICD-10-CM | POA: Diagnosis not present

## 2021-05-29 DIAGNOSIS — F411 Generalized anxiety disorder: Secondary | ICD-10-CM | POA: Diagnosis not present

## 2021-05-29 DIAGNOSIS — F331 Major depressive disorder, recurrent, moderate: Secondary | ICD-10-CM | POA: Diagnosis not present

## 2021-09-11 DIAGNOSIS — Z113 Encounter for screening for infections with a predominantly sexual mode of transmission: Secondary | ICD-10-CM | POA: Diagnosis not present

## 2021-09-11 DIAGNOSIS — Z01419 Encounter for gynecological examination (general) (routine) without abnormal findings: Secondary | ICD-10-CM | POA: Diagnosis not present

## 2021-10-02 DIAGNOSIS — F411 Generalized anxiety disorder: Secondary | ICD-10-CM | POA: Diagnosis not present

## 2021-10-02 DIAGNOSIS — F331 Major depressive disorder, recurrent, moderate: Secondary | ICD-10-CM | POA: Diagnosis not present

## 2021-10-19 DIAGNOSIS — R87611 Atypical squamous cells cannot exclude high grade squamous intraepithelial lesion on cytologic smear of cervix (ASC-H): Secondary | ICD-10-CM | POA: Diagnosis not present

## 2021-10-19 DIAGNOSIS — Z3202 Encounter for pregnancy test, result negative: Secondary | ICD-10-CM | POA: Diagnosis not present

## 2021-11-19 DIAGNOSIS — F331 Major depressive disorder, recurrent, moderate: Secondary | ICD-10-CM | POA: Diagnosis not present

## 2021-11-19 DIAGNOSIS — F411 Generalized anxiety disorder: Secondary | ICD-10-CM | POA: Diagnosis not present

## 2021-11-27 ENCOUNTER — Emergency Department (HOSPITAL_BASED_OUTPATIENT_CLINIC_OR_DEPARTMENT_OTHER)
Admission: EM | Admit: 2021-11-27 | Discharge: 2021-11-27 | Disposition: A | Discharge status: Home / Self Care | Payer: BC Managed Care – PPO | Attending: Emergency Medicine | Admitting: Emergency Medicine

## 2021-11-27 ENCOUNTER — Emergency Department (HOSPITAL_BASED_OUTPATIENT_CLINIC_OR_DEPARTMENT_OTHER): Payer: BC Managed Care – PPO

## 2021-11-27 ENCOUNTER — Encounter (HOSPITAL_BASED_OUTPATIENT_CLINIC_OR_DEPARTMENT_OTHER): Payer: Self-pay

## 2021-11-27 ENCOUNTER — Other Ambulatory Visit: Payer: Self-pay

## 2021-11-27 DIAGNOSIS — H538 Other visual disturbances: Secondary | ICD-10-CM | POA: Diagnosis not present

## 2021-11-27 DIAGNOSIS — R110 Nausea: Secondary | ICD-10-CM | POA: Insufficient documentation

## 2021-11-27 DIAGNOSIS — R202 Paresthesia of skin: Secondary | ICD-10-CM

## 2021-11-27 DIAGNOSIS — Z79899 Other long term (current) drug therapy: Secondary | ICD-10-CM | POA: Diagnosis not present

## 2021-11-27 DIAGNOSIS — N9489 Other specified conditions associated with female genital organs and menstrual cycle: Secondary | ICD-10-CM | POA: Insufficient documentation

## 2021-11-27 DIAGNOSIS — R200 Anesthesia of skin: Secondary | ICD-10-CM | POA: Diagnosis not present

## 2021-11-27 DIAGNOSIS — R29818 Other symptoms and signs involving the nervous system: Secondary | ICD-10-CM | POA: Diagnosis not present

## 2021-11-27 LAB — CBC WITH DIFFERENTIAL/PLATELET
Abs Immature Granulocytes: 0.01 10*3/uL (ref 0.00–0.07)
Basophils Absolute: 0.1 10*3/uL (ref 0.0–0.1)
Basophils Relative: 1 %
Eosinophils Absolute: 0.1 10*3/uL (ref 0.0–0.5)
Eosinophils Relative: 1 %
HCT: 37.6 % (ref 36.0–46.0)
Hemoglobin: 12.8 g/dL (ref 12.0–15.0)
Immature Granulocytes: 0 %
Lymphocytes Relative: 30 %
Lymphs Abs: 2.5 10*3/uL (ref 0.7–4.0)
MCH: 26.7 pg (ref 26.0–34.0)
MCHC: 34 g/dL (ref 30.0–36.0)
MCV: 78.5 fL — ABNORMAL LOW (ref 80.0–100.0)
Monocytes Absolute: 0.5 10*3/uL (ref 0.1–1.0)
Monocytes Relative: 6 %
Neutro Abs: 5.3 10*3/uL (ref 1.7–7.7)
Neutrophils Relative %: 62 %
Platelets: 290 10*3/uL (ref 150–400)
RBC: 4.79 MIL/uL (ref 3.87–5.11)
RDW: 14.6 % (ref 11.5–15.5)
WBC: 8.5 10*3/uL (ref 4.0–10.5)
nRBC: 0 % (ref 0.0–0.2)

## 2021-11-27 LAB — COMPREHENSIVE METABOLIC PANEL
ALT: 23 U/L (ref 0–44)
AST: 17 U/L (ref 15–41)
Albumin: 4.1 g/dL (ref 3.5–5.0)
Alkaline Phosphatase: 49 U/L (ref 38–126)
Anion gap: 7 (ref 5–15)
BUN: 11 mg/dL (ref 6–20)
CO2: 25 mmol/L (ref 22–32)
Calcium: 8.6 mg/dL — ABNORMAL LOW (ref 8.9–10.3)
Chloride: 105 mmol/L (ref 98–111)
Creatinine, Ser: 0.96 mg/dL (ref 0.44–1.00)
GFR, Estimated: >60 mL/min (ref >60)
Glucose, Bld: 97 mg/dL (ref 70–99)
Potassium: 3.9 mmol/L (ref 3.5–5.1)
Sodium: 137 mmol/L (ref 135–145)
Total Bilirubin: 0.4 mg/dL (ref 0.3–1.2)
Total Protein: 7 g/dL (ref 6.5–8.1)

## 2021-11-27 LAB — HCG, SERUM, QUALITATIVE: Preg, Serum: NEGATIVE

## 2021-11-27 LAB — CBG MONITORING, ED: Glucose-Capillary: 96 mg/dL (ref 70–99)

## 2021-11-27 MED ORDER — PROCHLORPERAZINE EDISYLATE 10 MG/2ML IJ SOLN
10.0000 mg | Freq: Once | INTRAMUSCULAR | Status: AC
  Administered 2021-11-27: 10 mg via INTRAVENOUS
  Filled 2021-11-27: qty 2

## 2021-11-27 MED ORDER — SODIUM CHLORIDE 0.9 % IV BOLUS
1000.0000 mL | Freq: Once | INTRAVENOUS | Status: AC
  Administered 2021-11-27: 1000 mL via INTRAVENOUS

## 2021-11-27 MED ORDER — DIPHENHYDRAMINE HCL 50 MG/ML IJ SOLN
25.0000 mg | Freq: Once | INTRAMUSCULAR | Status: AC
  Administered 2021-11-27: 25 mg via INTRAVENOUS
  Filled 2021-11-27: qty 1

## 2021-11-27 NOTE — ED Provider Notes (Signed)
[facility] EMERGENCY DEPARTMENT Provider Note   CSN: [id number] Arrival date & time: 11/27/21  1444     History  Chief Complaint  Patient presents with   Numbness    Colleen Page is a 31 y.o. female.  Patient with no pertinent past medical history presents today with chief complaint of facial numbness.  She states that at lunch she was eating orange chicken and all of a sudden developed right-sided facial numbness, vision changes in the right eye, and right-sided heaviness.  She states that originally she thought she might be having an allergic reaction and so she took Benadryl but did not have any improvement in her symptoms.  She states that she has been able to ambulate since onset of her symptoms, however she felt unsteady on her feet like she would fall over. She now states that her symptoms have improved in that her vision has normalized, however she still states that she feels heavy on the right side with associated numbness. She states that during the entire episode she remained pain free without any headache. Does endorse some nausea. Denies any history of similar events or any recent trauma.  The history is provided by the patient. No language interpreter was used.      Home Medications Prior to Admission medications   Medication Sig Start Date End Date Taking? Authorizing Provider  Aspirin-Acetaminophen-Caffeine (GOODY HEADACHE PO) Take 1 Package by mouth daily as needed (for headaches).    [provider]  cephALEXin (KEFLEX) 500 MG capsule Take 1 capsule (500 mg total) by mouth 2 (two) times daily. 09/23/13   Phineas Douglas, Carlos American, PA-C  HYDROcodone-acetaminophen (NORCO/VICODIN) 5-325 MG tablet Take 1-2 tablets by mouth every 6 (six) hours as needed. 05/09/21   Davonna Belling, MD      Allergies    Patient has no known allergies.    Review of Systems   Review of Systems  Constitutional:  Negative for chills and fever.  Eyes:  Positive for visual  disturbance.  Gastrointestinal:  Positive for nausea. Negative for abdominal pain, diarrhea and vomiting.  Neurological:  Positive for numbness. Negative for dizziness, tremors, seizures, syncope, facial asymmetry, speech difficulty, light-headedness and headaches.  All other systems reviewed and are negative.  Physical Exam Updated Vital Signs BP (!) 133/93    Pulse 85    Temp 98.1 F (36.7 C) (Oral)    Resp 11    Ht 5\' 7"  (1.702 m)    Wt 72.1 kg    LMP 11/24/2021    SpO2 100%    BMI 24.90 kg/m  Physical Exam Vitals and nursing note reviewed.  Constitutional:      General: She is not in acute distress.    Appearance: Normal appearance. She is normal weight. She is not ill-appearing, toxic-appearing or diaphoretic.     Comments: Patient resting comfortably in bed in no acute distress  HENT:     Head: Normocephalic and atraumatic.  Eyes:     General: No visual field deficit.    Extraocular Movements: Extraocular movements intact.     Conjunctiva/sclera: Conjunctivae normal.     Pupils: Pupils are equal, round, and reactive to light.  Cardiovascular:     Rate and Rhythm: Normal rate and regular rhythm.     Heart sounds: Normal heart sounds.  Pulmonary:     Effort: Pulmonary effort is normal. No respiratory distress.     Breath sounds: Normal breath sounds.  Abdominal:     General: Abdomen is  flat.     Palpations: Abdomen is soft.     Tenderness: There is no abdominal tenderness.  Musculoskeletal:        General: Normal range of motion.     Cervical back: Normal range of motion and neck supple.  Skin:    General: Skin is warm and dry.  Neurological:     General: No focal deficit present.     Mental Status: She is alert and oriented to person, place, and time.     GCS: GCS eye subscore is 4. GCS verbal subscore is 5. GCS motor subscore is 6.     Cranial Nerves: Cranial nerves 2-12 are intact. No cranial nerve deficit, dysarthria or facial asymmetry.     Sensory: Sensation is  intact.     Motor: Motor function is intact.     Coordination: Coordination is intact. Romberg sign negative. Finger-Nose-Finger Test normal. Rapid alternating movements normal.     Gait: Gait is intact.     Comments: Alert and oriented to self, place, time and event.    Speech is fluent, clear without dysarthria or dysphasia.    Strength 5/5 in upper/lower extremities   Sensation intact in upper/lower extremities,    CN I not tested  CN II grossly intact visual fields bilaterally. Did not visualize posterior eye.  CN III, IV, VI PERRLA and EOMs intact bilaterally  CN V Intact sensation to sharp and light touch to the face, does state that touch throughout the right side of her face feels different than the left CN VII facial movements symmetric  CN VIII not tested  CN IX, X no uvula deviation, symmetric rise of soft palate  CN XI 5/5 SCM and trapezius strength bilaterally  CN XII Midline tongue protrusion, symmetric L/R movements     ED Results / Procedures / Treatments   Labs (all labs ordered are listed, but only abnormal results are displayed) Labs Reviewed  CBC WITH DIFFERENTIAL/PLATELET - Abnormal; Notable for the following components:      Result Value   MCV 78.5 (*)    All other components within normal limits  COMPREHENSIVE METABOLIC PANEL - Abnormal; Notable for the following components:   Calcium 8.6 (*)    All other components within normal limits  HCG, SERUM, QUALITATIVE  CBG MONITORING, ED    EKG EKG Interpretation  Date/Time:  Tuesday November 27 2021 15:02:01 EST Ventricular Rate:  85 PR Interval:  148 QRS Duration: 70 QT Interval:  372 QTC Calculation: 442 R Axis:   93 Text Interpretation: Normal sinus rhythm Rightward axis Borderline ECG When compared with ECG of 09-May-2021 08:42, PREVIOUS ECG IS PRESENT No significant change since last tracing Confirmed by Deno Etienne [phone]) on 11/27/2021 3:43:26 PM  Radiology CT Head Wo Contrast  Result Date:  11/27/2021 CLINICAL DATA:  Neuro deficit, acute, stroke suspected EXAM: CT HEAD WITHOUT CONTRAST TECHNIQUE: Contiguous axial images were obtained from the base of the skull through the vertex without intravenous contrast. RADIATION DOSE REDUCTION: This exam was performed according to the departmental dose-optimization program which includes automated exposure control, adjustment of the mA and/or kV according to patient size and/or use of iterative reconstruction technique. COMPARISON:  None. FINDINGS: Brain: No evidence of large-territorial acute infarction. No parenchymal hemorrhage. No mass lesion. No extra-axial collection. No mass effect or midline shift. No hydrocephalus. Basilar cisterns are patent. Vascular: No hyperdense vessel. Skull: No acute fracture or focal lesion. Sinuses/Orbits: Paranasal sinuses and mastoid air cells are clear. The orbits  are unremarkable. Other: None. IMPRESSION: No acute intracranial abnormality. Electronically Signed   By: Iven Finn M.D.   On: 11/27/2021 17:04    Procedures Procedures    Medications Ordered in ED Medications  sodium chloride 0.9 % bolus 1,000 mL (1,000 mLs Intravenous New Bag/Given 11/27/21 1643)  diphenhydrAMINE (BENADRYL) injection 25 mg (25 mg Intravenous Given 11/27/21 1639)  prochlorperazine (COMPAZINE) injection 10 mg (10 mg Intravenous Given 11/27/21 1639)    ED Course/ Medical Decision Making/ A&P                           Medical Decision Making Amount and/or Complexity of Data Reviewed Labs: ordered. Radiology: ordered.  Risk Prescription drug management.   This patient presents to the ED for concern of right sided facial numbness, this involves an extensive number of treatment options, and is a complaint that carries with it a high risk of complications and morbidity.   Co morbidities that complicate the patient evaluation  none  Lab Tests:  I Ordered, and personally interpreted labs.  The pertinent results include:   normal CBG, no leukocytosis or anemia. No electrolyte abnormalities.  hCG negative   Imaging Studies ordered:  I ordered imaging studies including CT head I independently visualized and interpreted imaging which showed  No acute intracranial abnormality I agree with the radiologist interpretation   Cardiac Monitoring:  The patient was maintained on a cardiac monitor.  I personally viewed and interpreted the cardiac monitored which showed an underlying rhythm of: NSR   Medicines ordered and prescription drug management:  I ordered medication including compazine and benadryl  for nausea and fluids for potential dehydration  Reevaluation of the patient after these medicines showed that the patient resolved   Test Considered:  I considered further head imaging including CT angio vs MRI to evaluate for dissection vs CVA, however patient denies ever having any headaches and upon reevaluation after medications states that she is completely returned to baseline. She is also completely neurologically intact on my exam. Therefore will forego further imaging at this time. Discussed this with patient who is in agreement   Dispostion:  After consideration of the diagnostic results and the patients response to treatment, I feel that the patent would benefit from outpatient management with strict return precautions and close PCP follow-up.  Colleen Page presents with facial numbness Given the large differential diagnosis for [employer], the decision making in this case is of high complexity.  After evaluating all of the data points in this case, the presentation of Colleen Page is NOT consistent with skull fracture, meningitis/encephalitis, SAH/sentinel bleed, Intracranial Hemorrhage (ICH) (subdural/epidural), acute obstructive hydrocephalus, space occupying lesions, CVA, CO Poisoning, Basilar/vertebral artery dissection, preeclampsia, cerebral venous thrombosis, hypertensive emergency,  Idiopathic Intracranial Hypertension (pseudotumor cerebri).  Patient states that her facial numbness is throughout the right side of her face including above the eyebrow.  She also states that she is feels 'heaviness' throughout her right side.  She is also completely neurologically intact on my exam.  These symptoms are inconsistent with CVA. No facial droop, low suspicion of Bell's Palsy.  She is afebrile, nontoxic-appearing, and in no acute distress with reassuring vital signs.  Upon reevaluation after Benadryl Compazine and fluids, patient states that her symptoms have completely resolved. Given her age with lack of comorbid conditions, I have low suspicion for TIA as well. She is stable for discharge at this time, educated on red flag symptoms of prompt  immediate return.  Advised close PCP follow-up in the next few days.  She is understanding and amenable with plan.  Discharged in stable condition.  Strict return and follow-up precautions have been given by me personally or by detailed written instructions verbalized by nursing staff using the teach back method to patient/family/caregiver.  Data Reviewed/Counseling: I have reviewed the patient's vital signs, nursing notes, and other relevant tests/information. I had a detailed discussion regarding the historical points, exam findings, and any diagnostic results supporting the discharge diagnosis. I also discussed the need for outpatient follow-up and the need to return to the ED if symptoms worsen or if there are any questions or concerns that arise at hom   Findings and plan of care discussed with supervising physician Dr. Tyrone Nine who is in agreement.    Final Clinical Impression(s) / ED Diagnoses Final diagnoses:  Paresthesias    Rx / DC Orders ED Discharge Orders     None     An After Visit Summary was printed and given to the patient.     Nestor Lewandowsky 11/27/21 [place], [place], DO 11/27/21 1851

## 2021-11-27 NOTE — ED Triage Notes (Signed)
Numbness to right side of face and right sided weakness onset at 1440 today.  States feels weaker to right side. BS in triage 96.

## 2021-11-27 NOTE — ED Notes (Signed)
Informed Floyd MD of triage findings, no code stroke at this time.

## 2021-11-27 NOTE — Discharge Instructions (Addendum)
As we discussed, your work-up in the ER today was reassuring for acute abnormalities.  I recommend follow-up with your primary doctor in the next few days for continued evaluation and management.  Return if development of any new or worsening symptoms.

## 2021-12-10 DIAGNOSIS — F411 Generalized anxiety disorder: Secondary | ICD-10-CM | POA: Diagnosis not present

## 2021-12-10 DIAGNOSIS — F331 Major depressive disorder, recurrent, moderate: Secondary | ICD-10-CM | POA: Diagnosis not present

## 2021-12-10 DIAGNOSIS — Z3009 Encounter for other general counseling and advice on contraception: Secondary | ICD-10-CM | POA: Diagnosis not present

## 2021-12-19 DIAGNOSIS — F411 Generalized anxiety disorder: Secondary | ICD-10-CM | POA: Diagnosis not present

## 2021-12-19 DIAGNOSIS — F331 Major depressive disorder, recurrent, moderate: Secondary | ICD-10-CM | POA: Diagnosis not present

## 2022-01-01 LAB — OB RESULTS CONSOLE RPR: RPR: NONREACTIVE

## 2022-01-02 DIAGNOSIS — R112 Nausea with vomiting, unspecified: Secondary | ICD-10-CM | POA: Diagnosis not present

## 2022-01-02 DIAGNOSIS — R1310 Dysphagia, unspecified: Secondary | ICD-10-CM | POA: Diagnosis not present

## 2022-01-02 DIAGNOSIS — R12 Heartburn: Secondary | ICD-10-CM | POA: Diagnosis not present

## 2022-01-16 ENCOUNTER — Other Ambulatory Visit: Payer: Self-pay

## 2022-01-16 ENCOUNTER — Emergency Department (HOSPITAL_COMMUNITY)
Admission: EM | Admit: 2022-01-16 | Discharge: 2022-01-16 | Discharge status: Against Medical Advice | Payer: BC Managed Care – PPO | Attending: Physician Assistant | Admitting: Physician Assistant

## 2022-01-16 ENCOUNTER — Encounter (HOSPITAL_COMMUNITY): Payer: Self-pay

## 2022-01-16 DIAGNOSIS — Z5321 Procedure and treatment not carried out due to patient leaving prior to being seen by health care provider: Secondary | ICD-10-CM | POA: Diagnosis not present

## 2022-01-16 DIAGNOSIS — R1013 Epigastric pain: Secondary | ICD-10-CM | POA: Diagnosis not present

## 2022-01-16 DIAGNOSIS — R197 Diarrhea, unspecified: Secondary | ICD-10-CM | POA: Insufficient documentation

## 2022-01-16 DIAGNOSIS — R110 Nausea: Secondary | ICD-10-CM | POA: Diagnosis not present

## 2022-01-16 DIAGNOSIS — R531 Weakness: Secondary | ICD-10-CM | POA: Diagnosis not present

## 2022-01-16 DIAGNOSIS — R5383 Other fatigue: Secondary | ICD-10-CM | POA: Diagnosis not present

## 2022-01-16 DIAGNOSIS — K921 Melena: Secondary | ICD-10-CM | POA: Diagnosis not present

## 2022-01-16 DIAGNOSIS — K219 Gastro-esophageal reflux disease without esophagitis: Secondary | ICD-10-CM | POA: Insufficient documentation

## 2022-01-16 LAB — COMPREHENSIVE METABOLIC PANEL
ALT: 11 U/L (ref 0–44)
AST: 12 U/L — ABNORMAL LOW (ref 15–41)
Albumin: 4.2 g/dL (ref 3.5–5.0)
Alkaline Phosphatase: 52 U/L (ref 38–126)
Anion gap: 7 (ref 5–15)
BUN: 7 mg/dL (ref 6–20)
CO2: 24 mmol/L (ref 22–32)
Calcium: 8.9 mg/dL (ref 8.9–10.3)
Chloride: 109 mmol/L (ref 98–111)
Creatinine, Ser: 0.99 mg/dL (ref 0.44–1.00)
GFR, Estimated: >60 mL/min (ref >60)
Glucose, Bld: 89 mg/dL (ref 70–99)
Potassium: 3.6 mmol/L (ref 3.5–5.1)
Sodium: 140 mmol/L (ref 135–145)
Total Bilirubin: 0.3 mg/dL (ref 0.3–1.2)
Total Protein: 7.2 g/dL (ref 6.5–8.1)

## 2022-01-16 LAB — HCG, SERUM, QUALITATIVE: Preg, Serum: NEGATIVE

## 2022-01-16 LAB — TYPE AND SCREEN
ABO/RH(D): O POS
Antibody Screen: NEGATIVE

## 2022-01-16 NOTE — ED Triage Notes (Signed)
Pt arrived POV from home c/o epigastric pain x 3-4 days. Pt also endorses weakness and fatigue.  ?

## 2022-01-16 NOTE — ED Provider Triage Note (Signed)
Emergency Medicine Provider Triage Evaluation Note ? ?Colleen Page , a 31 y.o. female  was evaluated in triage.  Pt complains of black stools x few days.  Evaluated by GI today having exacerbation of epigastric pain and her reflux.  Currently prescribed omeprazole 40 mg daily with no improvement in symptoms.  Also endorsing dark tarry stools for the last couple of days.  Did not take any over-the-counter medication for improvement.  Also endorsing some nausea.  She also describes some urinary urgency, like she has a urinary tract infection.  Does have an endoscopy scheduled for the month of May. ? ?Review of Systems  ?Positive: Diarrhea, Abdominal pain, nausea ?Negative: Fever, vaginal discharge ? ?Physical Exam  ?There were no vitals taken for this visit. ?Gen:   Awake, no distress   ?Resp:  Normal effort  ?MSK:   Moves extremities without difficulty  ?Other:   ? ?Medical Decision Making  ?Medically screening exam initiated at 5:55 PM.  Appropriate orders placed.  Katrese Shell was informed that the remainder of the evaluation will be completed by another provider, this initial triage assessment does not replace that evaluation, and the importance of remaining in the ED until their evaluation is complete. ? ? ?  ?Janeece Fitting, PA-C ?01/16/22 1800 ? ?

## 2022-01-16 NOTE — ED Notes (Signed)
Patient gave registration labels and told them they were leaving ?

## 2022-02-01 ENCOUNTER — Emergency Department (HOSPITAL_BASED_OUTPATIENT_CLINIC_OR_DEPARTMENT_OTHER): Payer: BC Managed Care – PPO

## 2022-02-01 ENCOUNTER — Emergency Department (HOSPITAL_BASED_OUTPATIENT_CLINIC_OR_DEPARTMENT_OTHER)
Admission: EM | Admit: 2022-02-01 | Discharge: 2022-02-01 | Disposition: A | Discharge status: Home / Self Care | Payer: BC Managed Care – PPO | Attending: Emergency Medicine | Admitting: Emergency Medicine

## 2022-02-01 ENCOUNTER — Encounter (HOSPITAL_BASED_OUTPATIENT_CLINIC_OR_DEPARTMENT_OTHER): Payer: Self-pay | Admitting: *Deleted

## 2022-02-01 ENCOUNTER — Other Ambulatory Visit: Payer: Self-pay

## 2022-02-01 DIAGNOSIS — D259 Leiomyoma of uterus, unspecified: Secondary | ICD-10-CM

## 2022-02-01 DIAGNOSIS — R1032 Left lower quadrant pain: Secondary | ICD-10-CM | POA: Diagnosis not present

## 2022-02-01 DIAGNOSIS — N3000 Acute cystitis without hematuria: Secondary | ICD-10-CM | POA: Diagnosis not present

## 2022-02-01 DIAGNOSIS — K921 Melena: Secondary | ICD-10-CM | POA: Diagnosis not present

## 2022-02-01 HISTORY — DX: Gastro-esophageal reflux disease without esophagitis: K21.9

## 2022-02-01 LAB — COMPREHENSIVE METABOLIC PANEL
ALT: 14 U/L (ref 0–44)
AST: 11 U/L — ABNORMAL LOW (ref 15–41)
Albumin: 4.1 g/dL (ref 3.5–5.0)
Alkaline Phosphatase: 45 U/L (ref 38–126)
Anion gap: 7 (ref 5–15)
BUN: 8 mg/dL (ref 6–20)
CO2: 23 mmol/L (ref 22–32)
Calcium: 8.7 mg/dL — ABNORMAL LOW (ref 8.9–10.3)
Chloride: 106 mmol/L (ref 98–111)
Creatinine, Ser: 0.9 mg/dL (ref 0.44–1.00)
GFR, Estimated: >60 mL/min (ref >60)
Glucose, Bld: 87 mg/dL (ref 70–99)
Potassium: 4 mmol/L (ref 3.5–5.1)
Sodium: 136 mmol/L (ref 135–145)
Total Bilirubin: 0.3 mg/dL (ref 0.3–1.2)
Total Protein: 7.5 g/dL (ref 6.5–8.1)

## 2022-02-01 LAB — CBC WITH DIFFERENTIAL/PLATELET
Abs Immature Granulocytes: 0.02 10*3/uL (ref 0.00–0.07)
Basophils Absolute: 0 10*3/uL (ref 0.0–0.1)
Basophils Relative: 0 %
Eosinophils Absolute: 0.1 10*3/uL (ref 0.0–0.5)
Eosinophils Relative: 1 %
HCT: 38.2 % (ref 36.0–46.0)
Hemoglobin: 13.1 g/dL (ref 12.0–15.0)
Immature Granulocytes: 0 %
Lymphocytes Relative: 33 %
Lymphs Abs: 3 10*3/uL (ref 0.7–4.0)
MCH: 26.8 pg (ref 26.0–34.0)
MCHC: 34.3 g/dL (ref 30.0–36.0)
MCV: 78.1 fL — ABNORMAL LOW (ref 80.0–100.0)
Monocytes Absolute: 0.6 10*3/uL (ref 0.1–1.0)
Monocytes Relative: 6 %
Neutro Abs: 5.4 10*3/uL (ref 1.7–7.7)
Neutrophils Relative %: 60 %
Platelets: 290 10*3/uL (ref 150–400)
RBC: 4.89 MIL/uL (ref 3.87–5.11)
RDW: 14.5 % (ref 11.5–15.5)
WBC: 9.1 10*3/uL (ref 4.0–10.5)
nRBC: 0 % (ref 0.0–0.2)

## 2022-02-01 LAB — URINALYSIS, MICROSCOPIC (REFLEX)

## 2022-02-01 LAB — URINALYSIS, ROUTINE W REFLEX MICROSCOPIC
Bilirubin Urine: NEGATIVE
Glucose, UA: NEGATIVE mg/dL
Ketones, ur: NEGATIVE mg/dL
Nitrite: POSITIVE — AB
Protein, ur: NEGATIVE mg/dL
Specific Gravity, Urine: 1.015 (ref 1.005–1.030)
pH: 5.5 (ref 5.0–8.0)

## 2022-02-01 LAB — LIPASE, BLOOD: Lipase: 35 U/L (ref 11–51)

## 2022-02-01 LAB — PREGNANCY, URINE: Preg Test, Ur: NEGATIVE

## 2022-02-01 MED ORDER — FENTANYL CITRATE PF 50 MCG/ML IJ SOSY
50.0000 ug | PREFILLED_SYRINGE | Freq: Once | INTRAMUSCULAR | Status: AC
  Administered 2022-02-01: 50 ug via INTRAVENOUS
  Filled 2022-02-01: qty 1

## 2022-02-01 MED ORDER — ONDANSETRON HCL 4 MG/2ML IJ SOLN
4.0000 mg | Freq: Once | INTRAMUSCULAR | Status: AC
  Administered 2022-02-01: 4 mg via INTRAVENOUS
  Filled 2022-02-01: qty 2

## 2022-02-01 MED ORDER — CEPHALEXIN 500 MG PO CAPS: 500.0000 mg | ORAL_CAPSULE | Freq: Four times a day (QID) | ORAL | 0 refills | Status: DC

## 2022-02-01 MED ORDER — ONDANSETRON 8 MG PO TBDP: 8.0000 mg | ORAL_TABLET | Freq: Three times a day (TID) | ORAL | 0 refills | Status: AC | PRN

## 2022-02-01 MED ORDER — IOHEXOL 300 MG/ML  SOLN
100.0000 mL | Freq: Once | INTRAMUSCULAR | Status: AC | PRN
Start: 2022-02-01 — End: 2022-02-01
  Administered 2022-02-01: 100 mL via INTRAVENOUS

## 2022-02-01 NOTE — ED Notes (Signed)
Discharge paperwork given and understood. 

## 2022-02-01 NOTE — ED Triage Notes (Addendum)
LLQ pain today, onset around 1030 am. States she had normal BM today. LMP April 6 ?

## 2022-02-01 NOTE — ED Provider Notes (Signed)
?[facility] EMERGENCY DEPARTMENT ?Provider Note ? ? ?CSN: [id number] ?Arrival date & time: 02/01/22  1452 ? ?  ? ?History ? ?Chief Complaint  ?Patient presents with  ? Abdominal Pain  ? ? ?Delbert Vu is a 31 y.o. female. ? ? ?Abdominal Pain ? ?Patient with medical history of GERD presents today with left lower quadrant abdominal pain.  It started at 10:30 AM roughly 6 hours ago, its been constant.  The severity waxes and wanes, its worse when she ambulates. She endorses mild dysuria without hematuria. Feels better whenever she lays down, she also tried Tylenol with no relief.  No associated nausea, vomiting, fevers. ? ?No previous abdominal surgeries.  She is followed by a weeks GI, has endoscopy scheduled in May for melenic stool. ? ?Home Medications ?Prior to Admission medications   ?Medication Sig Start Date End Date Taking? Authorizing Provider  ?cephALEXin (KEFLEX) 500 MG capsule Take 1 capsule (500 mg total) by mouth 4 (four) times daily. 02/01/22  Yes [facility] Raring, PA-C  ?ondansetron (ZOFRAN-ODT) 8 MG disintegrating tablet Take 1 tablet (8 mg total) by mouth every 8 (eight) hours as needed for nausea or vomiting. 02/01/22  Yes [facility] Raring, PA-C  ?Aspirin-Acetaminophen-Caffeine (GOODY HEADACHE PO) Take 1 Package by mouth daily as needed (for headaches).    [provider]  ?HYDROcodone-acetaminophen (NORCO/VICODIN) 5-325 MG tablet Take 1-2 tablets by mouth every 6 (six) hours as needed. 05/09/21   Davonna Belling, MD  ?   ? ?Allergies    ?Patient has no known allergies.   ? ?Review of Systems   ?Review of Systems  ?Gastrointestinal:  Positive for abdominal pain.  ? ?Physical Exam ?Updated Vital Signs ?BP 130/83   Pulse 65   Temp 98 ?F (36.7 ?C) (Oral)   Resp (!) 24   Ht '5\' 7"'$  (1.702 m)   Wt 73.9 kg   LMP 01/17/2022   SpO2 100%   BMI 25.53 kg/m?  ?Physical Exam ?Vitals and nursing note reviewed. Exam conducted with a chaperone present.  ?Constitutional:   ?   Appearance: Normal  appearance.  ?HENT:  ?   Head: Normocephalic and atraumatic.  ?Eyes:  ?   General: No scleral icterus.    ?   Right eye: No discharge.     ?   Left eye: No discharge.  ?   Extraocular Movements: Extraocular movements intact.  ?   Pupils: Pupils are equal, round, and reactive to light.  ?Cardiovascular:  ?   Rate and Rhythm: Normal rate and regular rhythm.  ?   Pulses: Normal pulses.  ?   Heart sounds: Normal heart sounds. No murmur heard. ?  No friction rub. No gallop.  ?Pulmonary:  ?   Effort: Pulmonary effort is normal. No respiratory distress.  ?   Breath sounds: Normal breath sounds.  ?Abdominal:  ?   General: Abdomen is flat. Bowel sounds are normal. There is no distension.  ?   Palpations: Abdomen is soft.  ?   Tenderness: There is abdominal tenderness in the left lower quadrant.  ?Skin: ?   General: Skin is warm and dry.  ?   Coloration: Skin is not jaundiced.  ?Neurological:  ?   Mental Status: She is alert. Mental status is at baseline.  ?   Coordination: Coordination normal.  ? ? ?ED Results / Procedures / Treatments   ?Labs ?(all labs ordered are listed, but only abnormal results are displayed) ?Labs Reviewed  ?CBC WITH DIFFERENTIAL/PLATELET - Abnormal; Notable for  the following components:  ?    Result Value  ? MCV 78.1 (*)   ? All other components within normal limits  ?COMPREHENSIVE METABOLIC PANEL - Abnormal; Notable for the following components:  ? Calcium 8.7 (*)   ? AST 11 (*)   ? All other components within normal limits  ?URINALYSIS, ROUTINE W REFLEX MICROSCOPIC - Abnormal; Notable for the following components:  ? Hgb urine dipstick TRACE (*)   ? Nitrite POSITIVE (*)   ? Leukocytes,Ua MODERATE (*)   ? All other components within normal limits  ?URINALYSIS, MICROSCOPIC (REFLEX) - Abnormal; Notable for the following components:  ? Bacteria, UA MANY (*)   ? All other components within normal limits  ?LIPASE, BLOOD  ?PREGNANCY, URINE  ? ? ?EKG ?None ? ?Radiology ?CT Abdomen Pelvis W  Contrast ? ?Result Date: 02/01/2022 ?CLINICAL DATA:  Left lower quadrant pain. EXAM: CT ABDOMEN AND PELVIS WITH CONTRAST TECHNIQUE: Multidetector CT imaging of the abdomen and pelvis was performed using the standard protocol following bolus administration of intravenous contrast. RADIATION DOSE REDUCTION: This exam was performed according to the departmental dose-optimization program which includes automated exposure control, adjustment of the mA and/or kV according to patient size and/or use of iterative reconstruction technique. CONTRAST:  122m OMNIPAQUE IOHEXOL 300 MG/ML  SOLN COMPARISON:  None. FINDINGS: Lower chest: No acute abnormality. Hepatobiliary: A 10 mm well-defined focus of parenchymal low attenuation is seen within the posterior aspect of the right lobe liver. Additional tiny, similar appearing foci are seen within the right lobe. No gallstones, gallbladder wall thickening, or biliary dilatation. Pancreas: Unremarkable. No pancreatic ductal dilatation or surrounding inflammatory changes. Spleen: Normal in size without focal abnormality. Adrenals/Urinary Tract: Adrenal glands are unremarkable. Kidneys are normal, without renal calculi, focal lesion, or hydronephrosis. Bladder is unremarkable. Stomach/Bowel: Stomach is within normal limits. Appendix appears normal. No evidence of bowel wall thickening, distention, or inflammatory changes. Vascular/Lymphatic: No significant vascular findings are present. No enlarged abdominal or pelvic lymph nodes. Reproductive: Adjacent 3.9 cm x 3.2 cm and 2.6 cm x 2.0 cm areas of parenchymal low attenuation are seen within the posterior aspect of the uterine fundus (approximately 37.44 Hounsfield units). A 1.7 cm x 1.1 cm cyst is noted within the posterior aspect of the right adnexa. Other: No abdominal wall hernia or abnormality. No abdominopelvic ascites. Musculoskeletal: No acute or significant osseous findings. IMPRESSION: 1. Findings likely consistent with uterine  fibroids. Further evaluation with nonemergent pelvic ultrasound is recommended. 2. Small right adnexal cyst, likely ovarian in origin. No follow-up imaging is recommended. Reference: JACR 2020 Feb;17(2):248-254 3. Small hepatic cysts versus hemangiomas. Electronically Signed   By: TVirgina NorfolkM.D.   On: 02/01/2022 16:46   ? ?Procedures ?Procedures  ? ? ?Medications Ordered in ED ?Medications  ?ondansetron (ZOFRAN) injection 4 mg (4 mg Intravenous Given 02/01/22 1527)  ?fentaNYL (SUBLIMAZE) injection 50 mcg (50 mcg Intravenous Given 02/01/22 1527)  ?iohexol (OMNIPAQUE) 300 MG/ML solution 100 mL (100 mLs Intravenous Contrast Given 02/01/22 1619)  ? ? ?ED Course/ Medical Decision Making/ A&P ?  ?                        ?Medical Decision Making ?Amount and/or Complexity of Data Reviewed ?Labs: ordered. ?Radiology: ordered. ? ?Risk ?Prescription drug management. ? ? ?This patient presents to the ED for concern of lower abdominal pain, this involves an extensive number of treatment options, and is a complaint that carries with it a high risk  of complications and morbidity.  The differential diagnosis includes but not limited to diverticulitis, ruptured ectopic pregnancy, colitis, hemorrhagic ovarian cyst, ovarian torsion ? ?Medical history notable for GERD ? ? ?Additional history obtained:  ? ?I reviewed external records from patient's gastroenterology physician, patient has an upper endoscopy scheduled in May.  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 ? ?  ?Lab Tests: ? ?I ordered, viewed, and personally interpreted labs.  The pertinent results include:   ?-CBC without leukocytosis or anemia, stable hemoglobin at 13.1. ?- CMP: No electrolyte derangement, AKI or transaminitis. ?- Lipase negative ?-UA is notable for trace amounts of hematuria, she is notably nitrite positive with moderate leukocytes consistent with UTI. ?- Urine pregnancy is negative ?  ?Imaging Studies ordered: ? ?I directly  visualized the CT abdomen/pelvis, which showed right adnexal cyst and uterine fibroids.  No acute abdominal process noted. ? ?I agree with the radiologist interpretation ?  ? ?ECG/Cardiac monitoring:  ? ?The patient was maintained

## 2022-02-01 NOTE — Discharge Instructions (Addendum)
Take Keflex 4 times daily for the next 5 days. ?Follow-up with your GI doctor if the pain persists ?Follow-up with your primary next week for reevaluation to make sure things are improving after the ED visit. ?Return if things change or worsen. ?Follow-up with either your primary or your gynecologist to schedule an outpatient pelvic ultrasound regarding the uterine fibroids. ?

## 2022-02-05 DIAGNOSIS — R102 Pelvic and perineal pain: Secondary | ICD-10-CM | POA: Diagnosis not present

## 2022-02-05 DIAGNOSIS — D259 Leiomyoma of uterus, unspecified: Secondary | ICD-10-CM | POA: Diagnosis not present

## 2022-02-05 DIAGNOSIS — N3000 Acute cystitis without hematuria: Secondary | ICD-10-CM | POA: Diagnosis not present

## 2022-02-05 DIAGNOSIS — K5900 Constipation, unspecified: Secondary | ICD-10-CM | POA: Diagnosis not present

## 2022-03-01 DIAGNOSIS — R112 Nausea with vomiting, unspecified: Secondary | ICD-10-CM | POA: Diagnosis not present

## 2022-03-01 DIAGNOSIS — R12 Heartburn: Secondary | ICD-10-CM | POA: Diagnosis not present

## 2022-03-01 DIAGNOSIS — R1310 Dysphagia, unspecified: Secondary | ICD-10-CM | POA: Diagnosis not present

## 2022-03-01 DIAGNOSIS — B9681 Helicobacter pylori [H. pylori] as the cause of diseases classified elsewhere: Secondary | ICD-10-CM | POA: Diagnosis not present

## 2022-03-01 DIAGNOSIS — K2950 Unspecified chronic gastritis without bleeding: Secondary | ICD-10-CM | POA: Diagnosis not present

## 2022-03-01 DIAGNOSIS — A048 Other specified bacterial intestinal infections: Secondary | ICD-10-CM | POA: Diagnosis not present

## 2022-03-05 DIAGNOSIS — D259 Leiomyoma of uterus, unspecified: Secondary | ICD-10-CM | POA: Diagnosis not present

## 2022-03-05 DIAGNOSIS — N83201 Unspecified ovarian cyst, right side: Secondary | ICD-10-CM | POA: Diagnosis not present

## 2022-03-05 DIAGNOSIS — R102 Pelvic and perineal pain: Secondary | ICD-10-CM | POA: Diagnosis not present

## 2022-03-27 DIAGNOSIS — R12 Heartburn: Secondary | ICD-10-CM | POA: Diagnosis not present

## 2022-03-27 DIAGNOSIS — B9681 Helicobacter pylori [H. pylori] as the cause of diseases classified elsewhere: Secondary | ICD-10-CM | POA: Diagnosis not present

## 2022-03-27 DIAGNOSIS — K2970 Gastritis, unspecified, without bleeding: Secondary | ICD-10-CM | POA: Diagnosis not present

## 2022-03-27 DIAGNOSIS — R194 Change in bowel habit: Secondary | ICD-10-CM | POA: Diagnosis not present

## 2022-04-09 DIAGNOSIS — K580 Irritable bowel syndrome with diarrhea: Secondary | ICD-10-CM | POA: Diagnosis not present

## 2022-04-09 DIAGNOSIS — Z79899 Other long term (current) drug therapy: Secondary | ICD-10-CM | POA: Diagnosis not present

## 2022-04-09 DIAGNOSIS — N946 Dysmenorrhea, unspecified: Secondary | ICD-10-CM | POA: Diagnosis not present

## 2022-05-08 DIAGNOSIS — Z3491 Encounter for supervision of normal pregnancy, unspecified, first trimester: Secondary | ICD-10-CM | POA: Diagnosis not present

## 2022-05-13 DIAGNOSIS — N83201 Unspecified ovarian cyst, right side: Secondary | ICD-10-CM | POA: Diagnosis not present

## 2022-05-27 DIAGNOSIS — O368310 Maternal care for abnormalities of the fetal heart rate or rhythm, first trimester, not applicable or unspecified: Secondary | ICD-10-CM | POA: Diagnosis not present

## 2022-05-28 DIAGNOSIS — Z3480 Encounter for supervision of other normal pregnancy, unspecified trimester: Secondary | ICD-10-CM | POA: Diagnosis not present

## 2022-05-28 LAB — OB RESULTS CONSOLE HEPATITIS B SURFACE ANTIGEN
Hepatitis B Surface Ag: NEGATIVE
Hepatitis B Surface Ag: NEGATIVE

## 2022-05-28 LAB — HEPATITIS C ANTIBODY
HCV Ab: NEGATIVE
HCV Ab: NEGATIVE

## 2022-05-28 LAB — OB RESULTS CONSOLE RUBELLA ANTIBODY, IGM
Rubella: IMMUNE
Rubella: IMMUNE

## 2022-06-05 ENCOUNTER — Encounter (HOSPITAL_COMMUNITY): Payer: Self-pay

## 2022-06-05 ENCOUNTER — Inpatient Hospital Stay (HOSPITAL_COMMUNITY)
Admission: AD | Admit: 2022-06-05 | Discharge: 2022-06-05 | Disposition: A | Discharge status: Home / Self Care | Payer: BC Managed Care – PPO | Attending: Obstetrics and Gynecology | Admitting: Obstetrics and Gynecology

## 2022-06-05 DIAGNOSIS — O219 Vomiting of pregnancy, unspecified: Secondary | ICD-10-CM | POA: Diagnosis not present

## 2022-06-05 DIAGNOSIS — O99611 Diseases of the digestive system complicating pregnancy, first trimester: Secondary | ICD-10-CM | POA: Insufficient documentation

## 2022-06-05 DIAGNOSIS — Z3A09 9 weeks gestation of pregnancy: Secondary | ICD-10-CM | POA: Diagnosis not present

## 2022-06-05 DIAGNOSIS — K5900 Constipation, unspecified: Secondary | ICD-10-CM | POA: Diagnosis not present

## 2022-06-05 LAB — URINALYSIS, ROUTINE W REFLEX MICROSCOPIC
Bilirubin Urine: NEGATIVE
Glucose, UA: NEGATIVE mg/dL
Hgb urine dipstick: NEGATIVE
Ketones, ur: NEGATIVE mg/dL
Leukocytes,Ua: NEGATIVE
Nitrite: NEGATIVE
Protein, ur: NEGATIVE mg/dL
Specific Gravity, Urine: 1.015 (ref 1.005–1.030)
pH: 6 (ref 5.0–8.0)

## 2022-06-05 MED ORDER — METOCLOPRAMIDE HCL 10 MG PO TABS: 10.0000 mg | ORAL_TABLET | Freq: Four times a day (QID) | ORAL | 0 refills | Status: DC

## 2022-06-05 MED ORDER — LACTATED RINGERS IV BOLUS
1000.0000 mL | Freq: Once | INTRAVENOUS | Status: AC
  Administered 2022-06-05: 1000 mL via INTRAVENOUS

## 2022-06-05 MED ORDER — FAMOTIDINE IN NACL 20-0.9 MG/50ML-% IV SOLN
20.0000 mg | Freq: Once | INTRAVENOUS | Status: AC
  Administered 2022-06-05: 20 mg via INTRAVENOUS
  Filled 2022-06-05: qty 50

## 2022-06-05 MED ORDER — METOCLOPRAMIDE HCL 5 MG/ML IJ SOLN
10.0000 mg | Freq: Once | INTRAMUSCULAR | Status: AC
  Administered 2022-06-05: 10 mg via INTRAVENOUS
  Filled 2022-06-05: qty 2

## 2022-06-05 NOTE — MAU Note (Signed)
.  Colleen Page is a 31 y.o. at 16w2dhere in MAU reporting: N/V since 8/22 at 0600. Pt. Took zofran and B6 at 0930 today with no relief. Has not tolerated solids since 0600 on 8/22. Has tolerated sips of gatorade. Has known history of H.pylori and has not had follow up stool test.  LMP: 04/01/22 Onset of complaint: 06/04/22 0600 Pain score: 5/10 LUQ abdominal pain- tender Vitals:   06/05/22 1639  BP: 136/76  Pulse: 78  Resp: 18  Temp: 97.9 F (36.6 C)  SpO2: 100%

## 2022-06-05 NOTE — MAU Provider Note (Signed)
History     CSN: [id number]  Arrival date and time: 06/05/22 1551   None     Chief Complaint  Patient presents with   Nausea   Emesis    N/V since 06/04/22 at 0600    HPI Colleen Page is a 31 y.o. G3P1011 at 33w2dwho presents to MAU for nausea and vomiting. Patient reports ongoing nausea and vomiting since 8/22. She has been taking Zofran, Unisom and Vitamin B6 which have been helping. She reports she last took Zofran this morning and B6 this afternoon, but forgot to take Unisom last night. Endorses approximately 10 episodes of vomiting today with streaks of blood in vomit. She has not been able to keep down any solid food since Sunday at dinner time, but has been able to tolerate sips of Gatorade. Reports her mother is currently hospitalized at another facility and thinks she overdid herself today. She denies abdominal pain or vaginal bleeding. Has some constipation.  [facility] is scheduled on [facility].  OB History     Gravida  3   Para  1   Term  1   Preterm      AB  1   Living  1      SAB      IAB      Ectopic      Multiple      Live Births  1           Past Medical History:  Diagnosis Date   GERD (gastroesophageal reflux disease)     History reviewed. No pertinent surgical history.  History reviewed. No pertinent family history.  Social History   Tobacco Use   Smoking status: Former    Packs/day: 0.50    Types: Cigarettes    Passive exposure: Never   Smokeless tobacco: Never  Vaping Use   Vaping Use: Some days  Substance Use Topics   Alcohol use: Not Currently   Drug use: No    Allergies: No Known Allergies  Medications Prior to Admission  Medication Sig Dispense Refill Last Dose   ondansetron (ZOFRAN-ODT) 8 MG disintegrating tablet Take 1 tablet (8 mg total) by mouth every 8 (eight) hours as needed for nausea or vomiting. 20 tablet 0 06/04/2022   pantoprazole (PROTONIX) 40 MG tablet  Take 40 mg by mouth daily.   Past Week   Prenatal Vit-Fe Fumarate-FA (PRENATAL MULTIVITAMIN) TABS tablet Take 1 tablet by mouth daily at 12 noon.   06/05/2022   Aspirin-Acetaminophen-Caffeine (GOODY HEADACHE PO) Take 1 Package by mouth daily as needed (for headaches).   More than a month   cephALEXin (KEFLEX) 500 MG capsule Take 1 capsule (500 mg total) by mouth 4 (four) times daily. 20 capsule 0 More than a month   HYDROcodone-acetaminophen (NORCO/VICODIN) 5-325 MG tablet Take 1-2 tablets by mouth every 6 (six) hours as needed. 6 tablet 0 More than a month    Review of Systems  Constitutional: Negative.   Respiratory: Negative.    Gastrointestinal:  Positive for constipation, nausea and vomiting. Negative for abdominal pain.  Genitourinary: Negative.   Neurological: Negative.    Physical Exam   Blood pressure 128/71, pulse 83, temperature 97.9 F (36.6 C), temperature source Oral, resp. rate 18, height '5\' 7"'$  (1.702 m), weight 80.7 kg, last menstrual period 04/01/2022, SpO2 100 %.  Physical Exam Vitals and nursing note reviewed.  Constitutional:      General: She is not in acute distress.  Eyes:     Extraocular Movements: Extraocular movements intact.     Pupils: Pupils are equal, round, and reactive to light.  Cardiovascular:     Rate and Rhythm: Normal rate.  Pulmonary:     Effort: Pulmonary effort is normal.  Abdominal:     Palpations: Abdomen is soft.     Tenderness: There is no abdominal tenderness. There is no guarding.  Musculoskeletal:        General: Normal range of motion.  Skin:    General: Skin is warm and dry.  Neurological:     General: No focal deficit present.     Mental Status: She is alert and oriented to person, place, and time.  Psychiatric:        Mood and Affect: Mood normal.        Behavior: Behavior normal.        Judgment: Judgment normal.    MAU Course  Procedures  MDM UA LR bolus with IV Reglan and Pepcid PO challenge and able tolerate  crackers and water Patient reports nausea improved, no episodes of vomiting here. Reviewed continue medication regimen, will add Reglan. Diet reviewed.   Assessment and Plan  [redacted] weeks gestation of pregnancy Nausea and vomiting affecting pregnancy  - Discharge home in stable condition - Rx sent to pharmacy - Return to MAU sooner or as needed for new/worsening symptoms - Keep OB appointment as scheduled on 8/30   Renee Harder, [place] 06/05/2022, 7:37 PM

## 2022-06-12 DIAGNOSIS — Z3481 Encounter for supervision of other normal pregnancy, first trimester: Secondary | ICD-10-CM | POA: Diagnosis not present

## 2022-06-12 DIAGNOSIS — O219 Vomiting of pregnancy, unspecified: Secondary | ICD-10-CM | POA: Diagnosis not present

## 2022-06-28 DIAGNOSIS — Z3481 Encounter for supervision of other normal pregnancy, first trimester: Secondary | ICD-10-CM | POA: Diagnosis not present

## 2022-08-15 DIAGNOSIS — Z3490 Encounter for supervision of normal pregnancy, unspecified, unspecified trimester: Secondary | ICD-10-CM | POA: Diagnosis not present

## 2022-08-15 DIAGNOSIS — Z36 Encounter for antenatal screening for chromosomal anomalies: Secondary | ICD-10-CM | POA: Diagnosis not present

## 2022-08-15 DIAGNOSIS — Z3482 Encounter for supervision of other normal pregnancy, second trimester: Secondary | ICD-10-CM | POA: Diagnosis not present

## 2022-09-24 ENCOUNTER — Inpatient Hospital Stay (HOSPITAL_COMMUNITY)
Admission: AD | Admit: 2022-09-24 | Discharge: 2022-09-24 | Disposition: A | Discharge status: Home / Self Care | Payer: BC Managed Care – PPO | Attending: Obstetrics and Gynecology | Admitting: Obstetrics and Gynecology

## 2022-09-24 ENCOUNTER — Encounter (HOSPITAL_COMMUNITY): Payer: Self-pay | Admitting: Obstetrics and Gynecology

## 2022-09-24 DIAGNOSIS — Z79899 Other long term (current) drug therapy: Secondary | ICD-10-CM | POA: Diagnosis not present

## 2022-09-24 DIAGNOSIS — K219 Gastro-esophageal reflux disease without esophagitis: Secondary | ICD-10-CM | POA: Insufficient documentation

## 2022-09-24 DIAGNOSIS — O26892 Other specified pregnancy related conditions, second trimester: Secondary | ICD-10-CM | POA: Insufficient documentation

## 2022-09-24 DIAGNOSIS — O212 Late vomiting of pregnancy: Secondary | ICD-10-CM | POA: Insufficient documentation

## 2022-09-24 DIAGNOSIS — O99612 Diseases of the digestive system complicating pregnancy, second trimester: Secondary | ICD-10-CM | POA: Diagnosis not present

## 2022-09-24 DIAGNOSIS — Z3492 Encounter for supervision of normal pregnancy, unspecified, second trimester: Secondary | ICD-10-CM

## 2022-09-24 DIAGNOSIS — Z3A25 25 weeks gestation of pregnancy: Secondary | ICD-10-CM | POA: Insufficient documentation

## 2022-09-24 HISTORY — DX: Gastritis, unspecified, without bleeding: K29.70

## 2022-09-24 HISTORY — DX: Other specified bacterial intestinal infections: A04.8

## 2022-09-24 LAB — COMPREHENSIVE METABOLIC PANEL
ALT: 37 U/L (ref 0–44)
AST: 21 U/L (ref 15–41)
Albumin: 3 g/dL — ABNORMAL LOW (ref 3.5–5.0)
Alkaline Phosphatase: 60 U/L (ref 38–126)
Anion gap: 9 (ref 5–15)
BUN: <5 mg/dL — ABNORMAL LOW (ref 6–20)
CO2: 22 mmol/L (ref 22–32)
Calcium: 8.9 mg/dL (ref 8.9–10.3)
Chloride: 105 mmol/L (ref 98–111)
Creatinine, Ser: 0.59 mg/dL (ref 0.44–1.00)
GFR, Estimated: >60 mL/min (ref >60)
Glucose, Bld: 92 mg/dL (ref 70–99)
Potassium: 3.8 mmol/L (ref 3.5–5.1)
Sodium: 136 mmol/L (ref 135–145)
Total Bilirubin: 0.1 mg/dL — ABNORMAL LOW (ref 0.3–1.2)
Total Protein: 6.4 g/dL — ABNORMAL LOW (ref 6.5–8.1)

## 2022-09-24 LAB — URINALYSIS, ROUTINE W REFLEX MICROSCOPIC
Bilirubin Urine: NEGATIVE
Glucose, UA: NEGATIVE mg/dL
Hgb urine dipstick: NEGATIVE
Ketones, ur: NEGATIVE mg/dL
Leukocytes,Ua: NEGATIVE
Nitrite: NEGATIVE
Protein, ur: NEGATIVE mg/dL
Specific Gravity, Urine: 1.016 (ref 1.005–1.030)
pH: 5 (ref 5.0–8.0)

## 2022-09-24 LAB — CBC
HCT: 31.9 % — ABNORMAL LOW (ref 36.0–46.0)
Hemoglobin: 11 g/dL — ABNORMAL LOW (ref 12.0–15.0)
MCH: 26.9 pg (ref 26.0–34.0)
MCHC: 34.5 g/dL (ref 30.0–36.0)
MCV: 78 fL — ABNORMAL LOW (ref 80.0–100.0)
Platelets: 242 10*3/uL (ref 150–400)
RBC: 4.09 MIL/uL (ref 3.87–5.11)
RDW: 14.8 % (ref 11.5–15.5)
WBC: 11 10*3/uL — ABNORMAL HIGH (ref 4.0–10.5)
nRBC: 0 % (ref 0.0–0.2)

## 2022-09-24 MED ORDER — FAMOTIDINE 10 MG PO TABS: 10.0000 mg | ORAL_TABLET | Freq: Two times a day (BID) | ORAL | 0 refills | Status: DC

## 2022-09-24 MED ORDER — PANTOPRAZOLE SODIUM 40 MG PO TBEC: 40.0000 mg | DELAYED_RELEASE_TABLET | Freq: Two times a day (BID) | ORAL | 0 refills | Status: DC

## 2022-09-24 MED ORDER — FAMOTIDINE IN NACL 20-0.9 MG/50ML-% IV SOLN
20.0000 mg | Freq: Once | INTRAVENOUS | Status: AC
  Administered 2022-09-24: 20 mg via INTRAVENOUS
  Filled 2022-09-24: qty 50

## 2022-09-24 MED ORDER — LACTATED RINGERS IV SOLN: Freq: Once | INTRAVENOUS | Status: AC

## 2022-09-24 NOTE — MAU Provider Note (Signed)
History     CSN: [id number]  Arrival date and time: 09/24/22 0915   Event Date/Time   First Provider Initiated Contact with Patient 09/24/22 1118      Chief Complaint  Patient presents with   Abdominal Pain   HPI Colleen Page is a 31 y.o. G3P1011 at 61w1dwho presents to MAU with chief complaint of upper abdominal pain of 2/2 pervasive history of vomiting and gerd. Patient endorses an existing regimen of Pantoprazole 40 mg daily, Zofran '8mg'$  and Tums, which typically limits her vomiting to twice per day. Recently her vomiting has worsened and been accompanied by a new onset "stabbing" pain at the top of her abdomen. She has sought care with her Gastroenterologist but states that Provider has not seen her since summer 2023. She denies dizziness, weakness, syncope. She denies contractions, vaginal bleeding, DFM, fever or recent illness.  Patient receives care with EDorian Heckle(Delora Fuelis primary).  OB History     Gravida  3   Para  1   Term  1   Preterm      AB  1   Living  1      SAB      IAB      Ectopic      Multiple      Live Births  1           Past Medical History:  Diagnosis Date   Gastritis    GERD (gastroesophageal reflux disease)    H. pylori infection     Past Surgical History:  Procedure Laterality Date   NO PAST SURGERIES      History reviewed. No pertinent family history.  Social History   Tobacco Use   Smoking status: Former    Packs/day: 0.50    Types: Cigarettes    Passive exposure: Never   Smokeless tobacco: Never  Vaping Use   Vaping Use: Former  Substance Use Topics   Alcohol use: Not Currently   Drug use: No    Allergies: No Known Allergies  Medications Prior to Admission  Medication Sig Dispense Refill Last Dose   calcium carbonate (TUMS - DOSED IN MG ELEMENTAL CALCIUM) 500 MG chewable tablet Chew 1 tablet by mouth daily.   09/24/2022   ondansetron (ZOFRAN-ODT) 8 MG disintegrating tablet Take 1 tablet (8 mg total)  by mouth every 8 (eight) hours as needed for nausea or vomiting. 20 tablet 0 09/24/2022   Prenatal Vit-Fe Fumarate-FA (PRENATAL MULTIVITAMIN) TABS tablet Take 1 tablet by mouth daily at 12 noon.   09/23/2022   promethazine (PHENERGAN) 25 MG tablet Take 25 mg by mouth every 6 (six) hours as needed for nausea or vomiting.   09/23/2022   metoCLOPramide (REGLAN) 10 MG tablet Take 1 tablet (10 mg total) by mouth every 6 (six) hours. 30 tablet 0    pantoprazole (PROTONIX) 40 MG tablet Take 40 mg by mouth daily.       Review of Systems  Constitutional:  Positive for fatigue.  Gastrointestinal:  Positive for abdominal pain, nausea and vomiting.  All other systems reviewed and are negative.  Physical Exam   Blood pressure 130/71, pulse 85, temperature 98 F (36.7 C), resp. rate 18, height '5\' 7"'$  (1.702 m), weight 82.6 kg, last menstrual period 04/01/2022.  Physical Exam Vitals and nursing note reviewed. Exam conducted with a chaperone present.  Constitutional:      Appearance: She is well-developed. She is ill-appearing.  Cardiovascular:     Rate and Rhythm:  Normal rate and regular rhythm.     Heart sounds: Normal heart sounds.  Pulmonary:     Effort: Pulmonary effort is normal.     Breath sounds: Normal breath sounds.  Abdominal:     Tenderness: There is no abdominal tenderness.     Comments: Gravid  Skin:    General: Skin is warm.     Capillary Refill: Capillary refill takes less than 2 seconds.  Neurological:     Mental Status: She is alert and oriented to person, place, and time.  Psychiatric:        Mood and Affect: Mood normal.        Behavior: Behavior normal.     MAU Course  Procedures  MDM  --Reviewed guidelines for GERD management from Up To Date. Advised patient to increase Pantoprazole to 40 mg BID and initiate Pepcid BID. New regimen coordinated with [facility] Pharmacist.  --Reactive tracing: baseline 140, mod var, + accels, no decels --Toco: quiet --Stabbing pain  resolved with completion of Pepcid IVPB  Orders Placed This Encounter  Procedures   Urinalysis, Routine w reflex microscopic Urine, Clean Catch   CBC   Comprehensive metabolic panel   Insert peripheral IV   Discharge patient   Patient Vitals for the past 24 hrs:  BP Temp Pulse Resp Height Weight  09/24/22 0938 130/71 98 F (36.7 C) 85 18 '5\' 7"'$  (1.702 m) 82.6 kg   Results for orders placed or performed during the hospital encounter of 09/24/22 (from the past 24 hour(s))  CBC     Status: Abnormal   Collection Time: 09/24/22 10:09 AM  Result Value Ref Range   WBC 11.0 (H) 4.0 - 10.5 K/uL   RBC 4.09 3.87 - 5.11 MIL/uL   Hemoglobin 11.0 (L) 12.0 - 15.0 g/dL   HCT 31.9 (L) 36.0 - 46.0 %   MCV 78.0 (L) 80.0 - 100.0 fL   MCH 26.9 26.0 - 34.0 pg   MCHC 34.5 30.0 - 36.0 g/dL   RDW 14.8 11.5 - 15.5 %   Platelets 242 150 - 400 K/uL   nRBC 0.0 0.0 - 0.2 %  Comprehensive metabolic panel     Status: Abnormal   Collection Time: 09/24/22 10:09 AM  Result Value Ref Range   Sodium 136 135 - 145 mmol/L   Potassium 3.8 3.5 - 5.1 mmol/L   Chloride 105 98 - 111 mmol/L   CO2 22 22 - 32 mmol/L   Glucose, Bld 92 70 - 99 mg/dL   BUN <5 (L) 6 - 20 mg/dL   Creatinine, Ser 0.59 0.44 - 1.00 mg/dL   Calcium 8.9 8.9 - 10.3 mg/dL   Total Protein 6.4 (L) 6.5 - 8.1 g/dL   Albumin 3.0 (L) 3.5 - 5.0 g/dL   AST 21 15 - 41 U/L   ALT 37 0 - 44 U/L   Alkaline Phosphatase 60 38 - 126 U/L   Total Bilirubin 0.1 (L) 0.3 - 1.2 mg/dL   GFR, Estimated >60 >60 mL/min   Anion gap 9 5 - 15  Urinalysis, Routine w reflex microscopic Urine, Clean Catch     Status: Abnormal   Collection Time: 09/24/22 10:26 AM  Result Value Ref Range   Color, Urine YELLOW YELLOW   APPearance HAZY (A) CLEAR   Specific Gravity, Urine 1.016 1.005 - 1.030   pH 5.0 5.0 - 8.0   Glucose, UA NEGATIVE NEGATIVE mg/dL   Hgb urine dipstick NEGATIVE NEGATIVE   Bilirubin Urine NEGATIVE NEGATIVE  Ketones, ur NEGATIVE NEGATIVE mg/dL   Protein,  ur NEGATIVE NEGATIVE mg/dL   Nitrite NEGATIVE NEGATIVE   Leukocytes,Ua NEGATIVE NEGATIVE   Meds ordered this encounter  Medications   lactated ringers infusion   famotidine (PEPCID) IVPB 20 mg premix   pantoprazole (PROTONIX) 40 MG tablet    Sig: Take 1 tablet (40 mg total) by mouth 2 (two) times daily.    Dispense:  60 tablet    Refill:  0    Order Specific Question:   Supervising Provider    Answer:   Llana Aliment   famotidine (PEPCID) 10 MG tablet    Sig: Take 1 tablet (10 mg total) by mouth 2 (two) times daily.    Dispense:  60 tablet    Refill:  0    Order Specific Question:   Supervising Provider    Answer:   Radene Gunning [[id number]]   Assessment and Plan  --31 y.o. G3P1011 at [redacted]w[redacted]d --Chronic GERD and vomiting in pregnancy --Pain resolved with treatments in MAU --Update regimen for GERD management --Pain score 0/10 prior to discharge --Discharge home in stable condition  F/U: --Patient to reconnect with her Gastroenterologist PRN  SDarlina Rumpf MRisco MSN, CNM 09/24/2022, 4:20 PM

## 2022-09-24 NOTE — MAU Note (Signed)
.  Colleen Page is a 31 y.o. at 76w1dhere in MAU reporting: she usually has vomiting most days. But today she had upper abd /epigastric pain that she had never had before. Reported her emesis was a gray color which it has never looked like that. Pain is sharp and nagging. Also stated she had lower left side pain that started yesterday she says feels like it could be gas but is unsure.  Good fetal movement  reported and denies any vag bleeding or discharge.   Onset of complaint: yesterday Pain score: 8 Vitals:   09/24/22 0938  BP: 130/71  Pulse: 85  Resp: 18  Temp: 98 F (36.7 C)     FHT:140 Lab orders placed from triage:  u/a

## 2022-09-27 DIAGNOSIS — O210 Mild hyperemesis gravidarum: Secondary | ICD-10-CM | POA: Diagnosis not present

## 2022-09-27 DIAGNOSIS — K219 Gastro-esophageal reflux disease without esophagitis: Secondary | ICD-10-CM | POA: Diagnosis not present

## 2022-10-10 DIAGNOSIS — Z3482 Encounter for supervision of other normal pregnancy, second trimester: Secondary | ICD-10-CM | POA: Diagnosis not present

## 2022-10-10 DIAGNOSIS — Z3490 Encounter for supervision of normal pregnancy, unspecified, unspecified trimester: Secondary | ICD-10-CM | POA: Diagnosis not present

## 2022-10-10 LAB — OB RESULTS CONSOLE HIV ANTIBODY (ROUTINE TESTING)
HIV: NONREACTIVE
HIV: NONREACTIVE

## 2022-10-10 LAB — OB RESULTS CONSOLE RPR
RPR: NONREACTIVE
RPR: NONREACTIVE

## 2022-10-14 NOTE — L&D Delivery Note (Addendum)
Delivery Note Called to room for delivery. Patient was found to be complete and +1 station. Fetal head was delivered over intact perineum. No nuchal was present. Shoulders and body followed without difficulty. Infant was placed on mother's abdomen, mouth and nose were bulb suctioned and let out a vigorous cry. Delayed cord clamping was performed for 60 seconds. Cord clamped and cut by FOB. Venous cord blood was collected. Placenta delivered spontaneously. Cervix, vagina, perineum and labia were inspected and a first degree perineal laceration was noted. The first degree perineal laceration was repaired using 3-0 Vicryl in standard fashion. TXA 1g was given as prophylaxis. Uterus fundus firm. Placenta was inspected, found to be intact with a 3 vessel cord. Counts were correct.   At 3:34 PM a viable and healthy female was delivered via Vaginal, Spontaneous (Presentation: Right Occiput Anterior).  APGAR: 9,9 ; weight: pending. Placenta status: Spontaneous;Pathology, Intact.  Cord: 3 vessels with the following complications: None.  Cord pH: Not collected  Anesthesia: Epidural Episiotomy:  N/A Lacerations:  First degree perineal Suture Repair: 3.0 vicryl Est. Blood Loss (mL):  201  Mom to postpartum.  Baby to Couplet care / Skin to Skin.  Drema Dallas 01/02/2023, 3:46 PM

## 2022-10-16 DIAGNOSIS — O99810 Abnormal glucose complicating pregnancy: Secondary | ICD-10-CM | POA: Diagnosis not present

## 2022-10-16 DIAGNOSIS — Z3482 Encounter for supervision of other normal pregnancy, second trimester: Secondary | ICD-10-CM | POA: Diagnosis not present

## 2022-10-16 DIAGNOSIS — D696 Thrombocytopenia, unspecified: Secondary | ICD-10-CM | POA: Diagnosis not present

## 2022-11-13 DIAGNOSIS — Z124 Encounter for screening for malignant neoplasm of cervix: Secondary | ICD-10-CM | POA: Diagnosis not present

## 2022-11-13 DIAGNOSIS — R1011 Right upper quadrant pain: Secondary | ICD-10-CM | POA: Diagnosis not present

## 2022-11-13 DIAGNOSIS — Z23 Encounter for immunization: Secondary | ICD-10-CM | POA: Diagnosis not present

## 2022-11-21 DIAGNOSIS — O26843 Uterine size-date discrepancy, third trimester: Secondary | ICD-10-CM | POA: Diagnosis not present

## 2022-12-03 DIAGNOSIS — M25551 Pain in right hip: Secondary | ICD-10-CM | POA: Diagnosis not present

## 2022-12-06 DIAGNOSIS — Z1159 Encounter for screening for other viral diseases: Secondary | ICD-10-CM | POA: Diagnosis not present

## 2022-12-06 DIAGNOSIS — R5383 Other fatigue: Secondary | ICD-10-CM | POA: Diagnosis not present

## 2022-12-09 DIAGNOSIS — Z3490 Encounter for supervision of normal pregnancy, unspecified, unspecified trimester: Secondary | ICD-10-CM | POA: Diagnosis not present

## 2022-12-09 DIAGNOSIS — R030 Elevated blood-pressure reading, without diagnosis of hypertension: Secondary | ICD-10-CM | POA: Diagnosis not present

## 2022-12-09 DIAGNOSIS — Z3483 Encounter for supervision of other normal pregnancy, third trimester: Secondary | ICD-10-CM | POA: Diagnosis not present

## 2022-12-09 LAB — OB RESULTS CONSOLE GBS
GBS: NEGATIVE
GBS: NEGATIVE

## 2022-12-13 DIAGNOSIS — M25551 Pain in right hip: Secondary | ICD-10-CM | POA: Diagnosis not present

## 2022-12-23 DIAGNOSIS — O26843 Uterine size-date discrepancy, third trimester: Secondary | ICD-10-CM | POA: Diagnosis not present

## 2022-12-24 DIAGNOSIS — M25551 Pain in right hip: Secondary | ICD-10-CM | POA: Diagnosis not present

## 2023-01-02 ENCOUNTER — Inpatient Hospital Stay (HOSPITAL_COMMUNITY): Payer: BC Managed Care – PPO | Admitting: Anesthesiology

## 2023-01-02 ENCOUNTER — Inpatient Hospital Stay (HOSPITAL_COMMUNITY)
Admission: AD | Admit: 2023-01-02 | Discharge: 2023-01-03 | DRG: 807 | Disposition: A | Discharge status: Home / Self Care | Payer: BC Managed Care – PPO | Attending: Obstetrics and Gynecology | Admitting: Obstetrics and Gynecology

## 2023-01-02 ENCOUNTER — Other Ambulatory Visit: Payer: Self-pay

## 2023-01-02 ENCOUNTER — Encounter (HOSPITAL_COMMUNITY): Payer: Self-pay | Admitting: Obstetrics and Gynecology

## 2023-01-02 DIAGNOSIS — Z3A39 39 weeks gestation of pregnancy: Secondary | ICD-10-CM | POA: Diagnosis not present

## 2023-01-02 DIAGNOSIS — O411230 Chorioamnionitis, third trimester, not applicable or unspecified: Principal | ICD-10-CM | POA: Diagnosis present

## 2023-01-02 DIAGNOSIS — Z3A00 Weeks of gestation of pregnancy not specified: Secondary | ICD-10-CM | POA: Diagnosis not present

## 2023-01-02 DIAGNOSIS — O134 Gestational [pregnancy-induced] hypertension without significant proteinuria, complicating childbirth: Secondary | ICD-10-CM | POA: Diagnosis not present

## 2023-01-02 DIAGNOSIS — D259 Leiomyoma of uterus, unspecified: Secondary | ICD-10-CM | POA: Diagnosis present

## 2023-01-02 DIAGNOSIS — O9902 Anemia complicating childbirth: Secondary | ICD-10-CM | POA: Diagnosis present

## 2023-01-02 DIAGNOSIS — O479 False labor, unspecified: Secondary | ICD-10-CM

## 2023-01-02 DIAGNOSIS — Z87891 Personal history of nicotine dependence: Secondary | ICD-10-CM

## 2023-01-02 DIAGNOSIS — O26893 Other specified pregnancy related conditions, third trimester: Secondary | ICD-10-CM | POA: Diagnosis not present

## 2023-01-02 DIAGNOSIS — O3413 Maternal care for benign tumor of corpus uteri, third trimester: Secondary | ICD-10-CM | POA: Diagnosis present

## 2023-01-02 DIAGNOSIS — O365990 Maternal care for other known or suspected poor fetal growth, unspecified trimester, not applicable or unspecified: Secondary | ICD-10-CM | POA: Diagnosis not present

## 2023-01-02 DIAGNOSIS — D573 Sickle-cell trait: Secondary | ICD-10-CM | POA: Diagnosis present

## 2023-01-02 DIAGNOSIS — O163 Unspecified maternal hypertension, third trimester: Secondary | ICD-10-CM | POA: Diagnosis not present

## 2023-01-02 HISTORY — DX: Anemia, unspecified: D64.9

## 2023-01-02 LAB — COMPREHENSIVE METABOLIC PANEL
ALT: 13 U/L (ref 0–44)
AST: 16 U/L (ref 15–41)
Albumin: 2.5 g/dL — ABNORMAL LOW (ref 3.5–5.0)
Alkaline Phosphatase: 113 U/L (ref 38–126)
Anion gap: 7 (ref 5–15)
BUN: <5 mg/dL — ABNORMAL LOW (ref 6–20)
CO2: 21 mmol/L — ABNORMAL LOW (ref 22–32)
Calcium: 8.6 mg/dL — ABNORMAL LOW (ref 8.9–10.3)
Chloride: 106 mmol/L (ref 98–111)
Creatinine, Ser: 0.72 mg/dL (ref 0.44–1.00)
GFR, Estimated: >60 mL/min (ref >60)
Glucose, Bld: 98 mg/dL (ref 70–99)
Potassium: 4.2 mmol/L (ref 3.5–5.1)
Sodium: 134 mmol/L — ABNORMAL LOW (ref 135–145)
Total Bilirubin: 0.4 mg/dL (ref 0.3–1.2)
Total Protein: 6.2 g/dL — ABNORMAL LOW (ref 6.5–8.1)

## 2023-01-02 LAB — CBC
HCT: 30 % — ABNORMAL LOW (ref 36.0–46.0)
Hemoglobin: 10 g/dL — ABNORMAL LOW (ref 12.0–15.0)
MCH: 24.2 pg — ABNORMAL LOW (ref 26.0–34.0)
MCHC: 33.3 g/dL (ref 30.0–36.0)
MCV: 72.6 fL — ABNORMAL LOW (ref 80.0–100.0)
Platelets: 235 10*3/uL (ref 150–400)
RBC: 4.13 MIL/uL (ref 3.87–5.11)
RDW: 16.6 % — ABNORMAL HIGH (ref 11.5–15.5)
WBC: 14.2 10*3/uL — ABNORMAL HIGH (ref 4.0–10.5)
nRBC: 0 % (ref 0.0–0.2)

## 2023-01-02 LAB — PROTEIN / CREATININE RATIO, URINE
Creatinine, Urine: 37 mg/dL
Total Protein, Urine: <6 mg/dL

## 2023-01-02 LAB — CBC WITH DIFFERENTIAL/PLATELET
Abs Immature Granulocytes: 0.2 10*3/uL — ABNORMAL HIGH (ref 0.00–0.07)
Basophils Absolute: 0 10*3/uL (ref 0.0–0.1)
Basophils Relative: 0 %
Eosinophils Absolute: 0 10*3/uL (ref 0.0–0.5)
Eosinophils Relative: 0 %
HCT: 28.8 % — ABNORMAL LOW (ref 36.0–46.0)
Hemoglobin: 9.6 g/dL — ABNORMAL LOW (ref 12.0–15.0)
Immature Granulocytes: 1 %
Lymphocytes Relative: 7 %
Lymphs Abs: 1.3 10*3/uL (ref 0.7–4.0)
MCH: 24.1 pg — ABNORMAL LOW (ref 26.0–34.0)
MCHC: 33.3 g/dL (ref 30.0–36.0)
MCV: 72.2 fL — ABNORMAL LOW (ref 80.0–100.0)
Monocytes Absolute: 1.1 10*3/uL — ABNORMAL HIGH (ref 0.1–1.0)
Monocytes Relative: 6 %
Neutro Abs: 15.9 10*3/uL — ABNORMAL HIGH (ref 1.7–7.7)
Neutrophils Relative %: 86 %
Platelets: 222 10*3/uL (ref 150–400)
RBC: 3.99 MIL/uL (ref 3.87–5.11)
RDW: 16.5 % — ABNORMAL HIGH (ref 11.5–15.5)
WBC: 18.5 10*3/uL — ABNORMAL HIGH (ref 4.0–10.5)
nRBC: 0 % (ref 0.0–0.2)

## 2023-01-02 LAB — TYPE AND SCREEN
ABO/RH(D): O POS
Antibody Screen: NEGATIVE

## 2023-01-02 LAB — RPR: RPR Ser Ql: NONREACTIVE

## 2023-01-02 MED ORDER — SODIUM CHLORIDE 0.9 % IV SOLN
3.0000 g | Freq: Four times a day (QID) | INTRAVENOUS | Status: AC
  Administered 2023-01-02 – 2023-01-03 (×4): 3 g via INTRAVENOUS
  Filled 2023-01-02 (×3): qty 8

## 2023-01-02 MED ORDER — TERBUTALINE SULFATE 1 MG/ML IJ SOLN
INTRAMUSCULAR | Status: AC
  Administered 2023-01-02: 1 mg
  Filled 2023-01-02: qty 1

## 2023-01-02 MED ORDER — DIPHENHYDRAMINE HCL 50 MG/ML IJ SOLN: 12.5000 mg | INTRAMUSCULAR | Status: DC | PRN

## 2023-01-02 MED ORDER — TRANEXAMIC ACID-NACL 1000-0.7 MG/100ML-% IV SOLN
INTRAVENOUS | Status: AC
  Filled 2023-01-02: qty 100

## 2023-01-02 MED ORDER — PRENATAL MULTIVITAMIN CH
1.0000 | ORAL_TABLET | Freq: Every day | ORAL | Status: DC
  Administered 2023-01-03: 1 via ORAL
  Filled 2023-01-02: qty 1

## 2023-01-02 MED ORDER — ZOLPIDEM TARTRATE 5 MG PO TABS: 5.0000 mg | ORAL_TABLET | Freq: Every evening | ORAL | Status: DC | PRN

## 2023-01-02 MED ORDER — SOD CITRATE-CITRIC ACID 500-334 MG/5ML PO SOLN: 30.0000 mL | ORAL | Status: DC | PRN

## 2023-01-02 MED ORDER — ACETAMINOPHEN 325 MG PO TABS: 650.0000 mg | ORAL_TABLET | ORAL | Status: DC | PRN

## 2023-01-02 MED ORDER — ONDANSETRON HCL 4 MG PO TABS: 4.0000 mg | ORAL_TABLET | ORAL | Status: DC | PRN

## 2023-01-02 MED ORDER — COCONUT OIL OIL
1.0000 | TOPICAL_OIL | Status: DC | PRN
  Administered 2023-01-03: 1 via TOPICAL

## 2023-01-02 MED ORDER — LIDOCAINE HCL (PF) 1 % IJ SOLN: 30.0000 mL | INTRAMUSCULAR | Status: DC | PRN

## 2023-01-02 MED ORDER — OXYCODONE-ACETAMINOPHEN 5-325 MG PO TABS: 2.0000 | ORAL_TABLET | ORAL | Status: DC | PRN

## 2023-01-02 MED ORDER — LACTATED RINGERS IV SOLN: INTRAVENOUS | Status: DC

## 2023-01-02 MED ORDER — HYDRALAZINE HCL 20 MG/ML IJ SOLN: 10.0000 mg | INTRAMUSCULAR | Status: DC | PRN

## 2023-01-02 MED ORDER — LACTATED RINGERS IV SOLN
500.0000 mL | INTRAVENOUS | Status: DC | PRN
  Administered 2023-01-02 (×3): 500 mL via INTRAVENOUS

## 2023-01-02 MED ORDER — ONDANSETRON HCL 4 MG/2ML IJ SOLN
4.0000 mg | Freq: Four times a day (QID) | INTRAMUSCULAR | Status: DC | PRN
  Administered 2023-01-02: 4 mg via INTRAVENOUS
  Filled 2023-01-02: qty 2

## 2023-01-02 MED ORDER — AMMONIA AROMATIC IN INHA
RESPIRATORY_TRACT | Status: AC
  Filled 2023-01-02: qty 10

## 2023-01-02 MED ORDER — SIMETHICONE 80 MG PO CHEW: 80.0000 mg | CHEWABLE_TABLET | ORAL | Status: DC | PRN

## 2023-01-02 MED ORDER — WITCH HAZEL-GLYCERIN EX PADS: 1.0000 | MEDICATED_PAD | CUTANEOUS | Status: DC | PRN

## 2023-01-02 MED ORDER — TRANEXAMIC ACID-NACL 1000-0.7 MG/100ML-% IV SOLN
1000.0000 mg | INTRAVENOUS | Status: AC
  Administered 2023-01-02: 1000 mg via INTRAVENOUS

## 2023-01-02 MED ORDER — LABETALOL HCL 5 MG/ML IV SOLN: 80.0000 mg | INTRAVENOUS | Status: DC | PRN

## 2023-01-02 MED ORDER — ONDANSETRON HCL 4 MG/2ML IJ SOLN: 4.0000 mg | INTRAMUSCULAR | Status: DC | PRN

## 2023-01-02 MED ORDER — SODIUM CHLORIDE 0.9 % IV SOLN
3.0000 g | Freq: Four times a day (QID) | INTRAVENOUS | Status: DC
  Administered 2023-01-02: 3 g via INTRAVENOUS
  Filled 2023-01-02 (×4): qty 8

## 2023-01-02 MED ORDER — LACTATED RINGERS IV SOLN: 500.0000 mL | Freq: Once | INTRAVENOUS | Status: DC

## 2023-01-02 MED ORDER — OXYCODONE-ACETAMINOPHEN 5-325 MG PO TABS: 1.0000 | ORAL_TABLET | ORAL | Status: DC | PRN

## 2023-01-02 MED ORDER — IBUPROFEN 600 MG PO TABS
600.0000 mg | ORAL_TABLET | Freq: Four times a day (QID) | ORAL | Status: DC
  Administered 2023-01-02 – 2023-01-03 (×4): 600 mg via ORAL
  Filled 2023-01-02 (×5): qty 1

## 2023-01-02 MED ORDER — PHENYLEPHRINE 80 MCG/ML (10ML) SYRINGE FOR IV PUSH (FOR BLOOD PRESSURE SUPPORT): 80.0000 ug | PREFILLED_SYRINGE | INTRAVENOUS | Status: DC | PRN

## 2023-01-02 MED ORDER — BENZOCAINE-MENTHOL 20-0.5 % EX AERO: 1.0000 | INHALATION_SPRAY | CUTANEOUS | Status: DC | PRN

## 2023-01-02 MED ORDER — FENTANYL CITRATE (PF) 100 MCG/2ML IJ SOLN: 100.0000 ug | INTRAMUSCULAR | Status: DC | PRN

## 2023-01-02 MED ORDER — SENNOSIDES-DOCUSATE SODIUM 8.6-50 MG PO TABS
2.0000 | ORAL_TABLET | Freq: Every day | ORAL | Status: DC
  Administered 2023-01-03: 2 via ORAL
  Filled 2023-01-02: qty 2

## 2023-01-02 MED ORDER — LIDOCAINE-EPINEPHRINE (PF) 2 %-1:200000 IJ SOLN
INTRAMUSCULAR | Status: DC | PRN
  Administered 2023-01-02: 5 mL via EPIDURAL

## 2023-01-02 MED ORDER — EPHEDRINE 5 MG/ML INJ: 10.0000 mg | INTRAVENOUS | Status: DC | PRN

## 2023-01-02 MED ORDER — LABETALOL HCL 5 MG/ML IV SOLN: 20.0000 mg | INTRAVENOUS | Status: DC | PRN

## 2023-01-02 MED ORDER — LABETALOL HCL 5 MG/ML IV SOLN: 40.0000 mg | INTRAVENOUS | Status: DC | PRN

## 2023-01-02 MED ORDER — DIBUCAINE (PERIANAL) 1 % EX OINT: 1.0000 | TOPICAL_OINTMENT | CUTANEOUS | Status: DC | PRN

## 2023-01-02 MED ORDER — DIPHENHYDRAMINE HCL 25 MG PO CAPS: 25.0000 mg | ORAL_CAPSULE | Freq: Four times a day (QID) | ORAL | Status: DC | PRN

## 2023-01-02 MED ORDER — FENTANYL-BUPIVACAINE-NACL 0.5-0.125-0.9 MG/250ML-% EP SOLN
12.0000 mL/h | EPIDURAL | Status: DC | PRN
  Administered 2023-01-02: 12 mL/h via EPIDURAL
  Filled 2023-01-02: qty 250

## 2023-01-02 MED ORDER — OXYTOCIN BOLUS FROM INFUSION
333.0000 mL | Freq: Once | INTRAVENOUS | Status: AC
  Administered 2023-01-02: 333 mL via INTRAVENOUS

## 2023-01-02 MED ORDER — LACTATED RINGERS IV SOLN
500.0000 mL | Freq: Once | INTRAVENOUS | Status: AC
  Administered 2023-01-02: 1000 mL via INTRAVENOUS

## 2023-01-02 MED ORDER — OXYTOCIN-SODIUM CHLORIDE 30-0.9 UT/500ML-% IV SOLN
2.5000 [IU]/h | INTRAVENOUS | Status: DC
  Administered 2023-01-02: 2.5 [IU]/h via INTRAVENOUS
  Filled 2023-01-02: qty 500

## 2023-01-02 MED ORDER — ACETAMINOPHEN 325 MG PO TABS
650.0000 mg | ORAL_TABLET | ORAL | Status: DC | PRN
  Administered 2023-01-02: 650 mg via ORAL
  Filled 2023-01-02: qty 2

## 2023-01-02 NOTE — Progress Notes (Signed)
Returned to re-evaluate patient. She is comfortable but does feel the sensation with her contractions and reports rectal pressure. Cervix 9/100/0. FHT baseline 135bpm, moderate variability, + accel, - decel. A one minute deceleration was noted while checking patient's cervix on her back. Reassuring fetal heart tracing overall now. Primary RN to reassess in ~30 minutes, if cervix unchanged, we discussed augmentation with Pitocin 1x1 to allow for further labor progression.   Drema Dallas, DO

## 2023-01-02 NOTE — Anesthesia Preprocedure Evaluation (Addendum)
Anesthesia Evaluation  Patient identified by MRN, date of birth, ID band Patient awake    Reviewed: Allergy & Precautions, Patient's Chart, lab work & pertinent test results  Airway Mallampati: II       Dental no notable dental hx.    Pulmonary former smoker   Pulmonary exam normal        Cardiovascular hypertension, Normal cardiovascular exam     Neuro/Psych    GI/Hepatic ,GERD  ,,  Endo/Other    Renal/GU      Musculoskeletal   Abdominal   Peds  Hematology   Anesthesia Other Findings   Reproductive/Obstetrics (+) Pregnancy                             Anesthesia Physical Anesthesia Plan  ASA: 2  Anesthesia Plan: Epidural   Post-op Pain Management:    Induction:   PONV Risk Score and Plan: 0  Airway Management Planned: Natural Airway  Additional Equipment: None  Intra-op Plan:   Post-operative Plan:   Informed Consent: I have reviewed the patients History and Physical, chart, labs and discussed the procedure including the risks, benefits and alternatives for the proposed anesthesia with the patient or authorized representative who has indicated his/her understanding and acceptance.       Plan Discussed with:   Anesthesia Plan Comments: (Lab Results      Component                Value               Date                      WBC                      14.2 (H)            01/02/2023                HGB                      10.0 (L)            01/02/2023                HCT                      30.0 (L)            01/02/2023                MCV                      72.6 (L)            01/02/2023                PLT                      235                 01/02/2023           )       Anesthesia Quick Evaluation

## 2023-01-02 NOTE — Progress Notes (Signed)
2 units of pitocin administered at 1524 per MD bedside to bring ctx closer together.

## 2023-01-02 NOTE — Progress Notes (Signed)
Returned to room to evaluate patient for recurrent late decelerations - nadir of deceleration to 90s from baseline of 125bpm. Upon arrival, patient having foley catheter placed by primary RN and is laying on her back. Pain is 0/10 and is comfortable with epidural. Cervix 9/100/0 station, no forebag (suspect previous ruptured membranes as patient reports having felt a gush at some point prior to arrival). While fetal heart rate was in the 120s, attempted to have patient bear down to reduce cervix but this was unsuccessful. Fetal heart rate deceleration to the 90s noted x 5 minutes. Patient repositioned on each side and then in [place]wolf position. Fetal heart rate returned to 125bpm baseline with moderate variability after positioning the patient back on the left side. IVF bolus being administered. BP normotensive. Terbutaline 0.25mg  administered. Cervix rechecked and still 9/100/0 station. I counseled the patient regarding possible need for cesarean delivery if fetal bradycardia returns unless immediately ready for delivery. Will reassess in ~15 minutes.  I have explained to the patient that this surgery is performed to deliver their baby or babies through an incision in the abdomen and incision in the uterus.  Prior to surgery, the risks and benefits of the surgery, as well as alternative treatments were discussed.  The risks include, but are not limited to, possible need for cesarean delivery for all future pregnancies, bleeding at the time of surgery that could necessitate a blood transfusion and/or hysterectomy, rupture of the uterus during a future pregnancy that could cause a preterm delivery and/or requiring hysterectomy, infection, damage to surrounding organs and tissues, damage to bladder, damage to ureters, causing kidney damage, and requiring additional procedures, damage to bowels, resulting in further surgery, postoperative pain, short-term and long-term, scarring on the abdominal wall and  intra-abdominally, need for further surgery, development of an incisional hernia, deep vein thrombosis and/or pulmonary embolism, wound infection and/or separation, painful intercourse, urinary leakage, impact on future pregnancies including but not limited to, abnormal location or attachment of the placenta to the uterus, such as placenta previa or accreta, that may necessitate a blood transfusion and/or hysterectomy, impact on total family size, complications the course of which cannot be predicted or prevented, and death. Patient was consented for blood products.  The patient is aware that bleeding may result in the need for a blood transfusion which includes risk of transmission of HIV (1:2 million), Hepatitis C (1:2 million), and Hepatitis B (1:200 thousand) and transfusion reaction.  Patient voiced understanding of the above risks as well as understanding of indications for blood transfusion.  Drema Dallas, DO

## 2023-01-02 NOTE — MAU Provider Note (Signed)
Chief Complaint:  Labor Eval, Contractions, and Vaginal Discharge   None    HPI: Colleen Page is a 32 y.o. G3P1011 at 42w3dwho presents to maternity admissions reporting painful contractions.  Initial BP noted to be elevated so I was asked to see her.  . She reports good fetal movement, denies LOF, urinary symptoms, h/a, dizziness, n/v, diarrhea, constipation or fever/chills.  She denies headache, visual changes or RUQ abdominal pain.  Vaginal Discharge The patient's primary symptoms include pelvic pain and vaginal discharge. The current episode started today. She is pregnant. Associated symptoms include abdominal pain. Pertinent negatives include no chills, fever or headaches. She has not been passing clots. She has not been passing tissue. Nothing aggravates the symptoms. She has tried nothing for the symptoms.    Past Medical History: Past Medical History:  Diagnosis Date   Anemia    Gastritis    GERD (gastroesophageal reflux disease)    H. pylori infection     Past obstetric history: OB History  Gravida Para Term Preterm AB Living  3 1 1   1 1   SAB IAB Ectopic Multiple Live Births          1    # Outcome Date GA Lbr Len/2nd Weight Sex Delivery Anes PTL Lv  3 Current           2 AB 07/2011 [redacted]w[redacted]d    TAB     1 Term 10/29/08 [redacted]w[redacted]d   M Vag-Spont EPI N LIV     Complications: Pre-eclampsia    Past Surgical History: Past Surgical History:  Procedure Laterality Date   NO PAST SURGERIES      Family History: History reviewed. No pertinent family history.  Social History: Social History   Tobacco Use   Smoking status: Former    Packs/day: .5    Types: Cigarettes    Passive exposure: Never   Smokeless tobacco: Never  Vaping Use   Vaping Use: Former  Substance Use Topics   Alcohol use: Not Currently   Drug use: No    Allergies: No Known Allergies  Meds:  Medications Prior to Admission  Medication Sig Dispense Refill Last Dose   Prenatal Vit-Fe Fumarate-FA  (PRENATAL MULTIVITAMIN) TABS tablet Take 1 tablet by mouth daily at 12 noon.   Past Week   calcium carbonate (TUMS - DOSED IN MG ELEMENTAL CALCIUM) 500 MG chewable tablet Chew 1 tablet by mouth daily.      famotidine (PEPCID) 10 MG tablet Take 1 tablet (10 mg total) by mouth 2 (two) times daily. 60 tablet 0    metoCLOPramide (REGLAN) 10 MG tablet Take 1 tablet (10 mg total) by mouth every 6 (six) hours. 30 tablet 0    ondansetron (ZOFRAN-ODT) 8 MG disintegrating tablet Take 1 tablet (8 mg total) by mouth every 8 (eight) hours as needed for nausea or vomiting. 20 tablet 0    pantoprazole (PROTONIX) 40 MG tablet Take 1 tablet (40 mg total) by mouth 2 (two) times daily. 60 tablet 0    promethazine (PHENERGAN) 25 MG tablet Take 25 mg by mouth every 6 (six) hours as needed for nausea or vomiting.       I have reviewed patient's Past Medical Hx, Surgical Hx, Family Hx, Social Hx, medications and allergies.   ROS:  Review of Systems  Constitutional:  Negative for chills and fever.  Gastrointestinal:  Positive for abdominal pain.  Genitourinary:  Positive for pelvic pain and vaginal discharge.  Neurological:  Negative for headaches.  Other systems negative  Physical Exam  Patient Vitals for the past 24 hrs:  BP Temp Temp src Pulse Resp SpO2 Height Weight  01/02/23 0515 136/88 -- -- (!) 105 -- 99 % -- --  01/02/23 0510 -- -- -- -- -- 99 % -- --  01/02/23 0505 -- -- -- -- -- 99 % -- --  01/02/23 0500 (!) 142/96 -- -- 88 -- 99 % -- --  01/02/23 0458 (!) 151/87 -- -- 85 -- -- -- --  01/02/23 0455 -- -- -- -- -- 99 % -- --  01/02/23 0440 -- -- -- -- -- 100 % -- --  01/02/23 0435 -- -- -- -- -- 99 % -- --  01/02/23 0430 130/72 -- -- 94 -- 100 % -- --  01/02/23 0425 130/75 -- -- 96 -- 99 % -- --  01/02/23 0420 -- -- -- -- -- 99 % -- --  01/02/23 0415 -- -- -- -- -- 99 % -- --  01/02/23 0410 -- -- -- -- -- 99 % -- --  01/02/23 0405 -- -- -- -- -- 99 % -- --  01/02/23 0400 137/78 -- -- 95 -- 99  % -- --  01/02/23 0350 -- -- -- -- -- 99 % -- --  01/02/23 0345 (!) 146/86 -- -- (!) 108 -- 100 % -- --  01/02/23 0339 (!) 146/86 98.5 F (36.9 C) Oral (!) 106 18 99 % 5\' 7"  (1.702 m) 92.5 kg   Constitutional: Well-developed, well-nourished female in no acute distress.  Cardiovascular: normal rate and rhythm Respiratory: normal effort, clear to auscultation bilaterally GI: Abd soft, non-tender, gravid appropriate for gestational age.   No rebound or guarding. MS: Extremities nontender, no edema, normal ROM Neurologic: Alert and oriented x 4.  GU: Neg CVAT.  PELVIC EXAM:  Dilation: 3 Effacement (%): 60 Cervical Position: Middle Station: -2 Presentation: Vertex Exam by:: Huston Foley RN  FHT:  Baseline 140 , moderate variability, accelerations present, several variable decelerations Contractions: Irregular     Labs: Results for orders placed or performed during the hospital encounter of 01/02/23 (from the past 24 hour(s))  Protein / creatinine ratio, urine     Status: None   Collection Time: 01/02/23  4:19 AM  Result Value Ref Range   Creatinine, Urine 37 mg/dL   Total Protein, Urine <6 mg/dL   Protein Creatinine Ratio        0.00 - 0.15 mg/mg[Cre]  Type and screen [facility]     Status: None   Collection Time: 01/02/23  5:13 AM  Result Value Ref Range   ABO/RH(D) O POS    Antibody Screen NEG    Sample Expiration      01/05/2023,2359 Performed at [facility], [place] [address], [place], [place] [address]   CBC     Status: Abnormal   Collection Time: 01/02/23  5:15 AM  Result Value Ref Range   WBC 14.2 (H) 4.0 - 10.5 K/uL   RBC 4.13 3.87 - 5.11 MIL/uL   Hemoglobin 10.0 (L) 12.0 - 15.0 g/dL   HCT 30.0 (L) 36.0 - 46.0 %   MCV 72.6 (L) 80.0 - 100.0 fL   MCH 24.2 (L) 26.0 - 34.0 pg   MCHC 33.3 30.0 - 36.0 g/dL   RDW 16.6 (H) 11.5 - 15.5 %   Platelets 235 150 - 400 K/uL   nRBC 0.0 0.0 - 0.2 %  Comprehensive metabolic panel     Status:  Abnormal    Collection Time: 01/02/23  5:15 AM  Result Value Ref Range   Sodium 134 (L) 135 - 145 mmol/L   Potassium 4.2 3.5 - 5.1 mmol/L   Chloride 106 98 - 111 mmol/L   CO2 21 (L) 22 - 32 mmol/L   Glucose, Bld 98 70 - 99 mg/dL   BUN <5 (L) 6 - 20 mg/dL   Creatinine, Ser 0.72 0.44 - 1.00 mg/dL   Calcium 8.6 (L) 8.9 - 10.3 mg/dL   Total Protein 6.2 (L) 6.5 - 8.1 g/dL   Albumin 2.5 (L) 3.5 - 5.0 g/dL   AST 16 15 - 41 U/L   ALT 13 0 - 44 U/L   Alkaline Phosphatase 113 38 - 126 U/L   Total Bilirubin 0.4 0.3 - 1.2 mg/dL   GFR, Estimated >60 >60 mL/min   Anion gap 7 5 - 15   --/--/O POS (03/21 [id number])  Imaging:  No results found.  MAU Course/MDM: I have reviewed the triage vital signs and the nursing notes.   Pertinent labs & imaging results that were available during my care of the patient were reviewed by me and considered in my medical decision making (see chart for details).      I have reviewed her medical records including past results, notes and treatments.   I have ordered labs and reviewed results.  NST reviewed Treatments in MAU included EFM.    Assessment: Single IUP at [redacted]w[redacted]d Early active labor Probable gestational hypertension without evidence of preeclampsia Occasional variable decelerations  Plan: Admit to Labor and Delivery Routine orders MD to follow  Hansel Feinstein CNM, MSN Certified Nurse-Midwife 01/02/2023 6:38 AM

## 2023-01-02 NOTE — Anesthesia Procedure Notes (Signed)
Epidural Patient location during procedure: OB Start time: 01/02/2023 7:19 AM End time: 01/02/2023 7:24 AM  Staffing Anesthesiologist: Effie Berkshire, MD Performed: anesthesiologist   Preanesthetic Checklist Completed: patient identified, IV checked, site marked, risks and benefits discussed, surgical consent, monitors and equipment checked, pre-op evaluation and timeout performed  Epidural Patient position: sitting Prep: DuraPrep Patient monitoring: heart rate, continuous pulse ox and blood pressure Approach: midline Location: L3-L4 Injection technique: LOR saline  Needle:  Needle type: Tuohy  Needle gauge: 17 G Needle length: 9 cm Catheter type: closed end flexible Catheter size: 20 Guage Test dose: negative and 1.5% lidocaine  Assessment Events: blood not aspirated, no cerebrospinal fluid, injection not painful, no injection resistance and no paresthesia  Additional Notes LOR @ 4  Patient identified. Risks/Benefits/Options discussed with patient including but not limited to bleeding, infection, nerve damage, paralysis, failed block, incomplete pain control, headache, blood pressure changes, nausea, vomiting, reactions to medications, itching and postpartum back pain. Confirmed with bedside nurse the patient's most recent platelet count. Confirmed with patient that they are not currently taking any anticoagulation, have any bleeding history or any family history of bleeding disorders. Patient expressed understanding and wished to proceed. All questions were answered. Sterile technique was used throughout the entire procedure. Please see nursing notes for vital signs. Test dose was given through epidural catheter and negative prior to continuing to dose epidural or start infusion. Warning signs of high block given to the patient including shortness of breath, tingling/numbness in hands, complete motor block, or any concerning symptoms with instructions to call for help. Patient was  given instructions on fall risk and not to get out of bed. All questions and concerns addressed with instructions to call with any issues or inadequate analgesia.    Reason for block:procedure for pain

## 2023-01-02 NOTE — Progress Notes (Signed)
OB Progress Note  S: Patient resting comfortably with epidural. Feels rectal pressure. Consents to IUPC placement.  O: BP 123/89   Pulse (!) 113   Temp 99.8 F (37.7 C) (Axillary)   Resp 18   Ht 5\' 7"  (1.702 m)   Wt 92.5 kg   LMP 04/01/2022   SpO2 100%   BMI 31.94 kg/m   FHT: 145bpm, moderate variablity, + accels, occasional variable decels Toco: q2-4 minutes SVE: 9.5/100/0, anterior rim of the cervix is mildly edematous, palpates occiput posterior, IUPC placed  Bedside US: Occiput posterior position  A/P: 32 y.o. G3P1011 @ [redacted]w[redacted]d admitted for labor.  FWB: Cat. II due to occasional variables (overall reassuring) Labor course: Progressed naturally, has had protracted labor course due to occiput posterior position, IUPC placed, will calculate MVUs Pain: Epidural GBS: Negative Anticipate SVD  Suspect protracted labor due to OP position. Primary RN placed in "froggy bottoms" position to encourage fetal rotation. Will start pitocin 1x1 (was not previously started due to variable decelerations) and titrate as tolerated. If in ~4 hours there has been no cervical change or persistent variable decelerations, discussed delivery via cesarean delivery. Patient verbalized understanding and agrees.  Drema Dallas, DO

## 2023-01-02 NOTE — MAU Note (Signed)
.  Colleen Page is a 32 y.o. at [redacted]w[redacted]d here in MAU reporting: CTX since 0100. Pt reports lost her mucus plug earlier yesterday, and has had bloody show mixed discharge. Endorse FM. Pt denies VB, LOF, PIH s/s, and complications in the pregnancy. Recent intercourse Monday night GBS neg Onset of complaint: 0100 Pain score: 7/10 Vitals:   01/02/23 0339  BP: (!) 146/86  Pulse: (!) 106  Resp: 18  Temp: 98.5 F (36.9 C)  SpO2: 99%     FHT:145 Lab orders placed from triage:

## 2023-01-02 NOTE — H&P (Signed)
HPI: 32 y.o. G3P1011 @ [redacted]w[redacted]d estimated gestational age (as dated by LMP c/w 7 week ultrasound) presents for labor. In MAU, patient progressed from 1 to 3cm. She was also noted to have mild range blood pressures. Has now ruled in for gestational HTN based on previous mild range x 1 in the office. Currently, her epidural was just placed and she would like to wait until it fully works before AROM. Desires expectant management of labor at this time.  Leakage of fluid:  No Vaginal bleeding:  No Contractions:  Yes Fetal movement:  Yes  Prenatal care has been provided by Dr. Wellington [place]. [facility]).  ROS:  Denies fevers, chills, chest pain, visual changes, SOB, RUQ/epigastric pain, N/V, dysuria, hematuria, or sudden onset/worsening bilateral LE or facial edema.  Pregnancy complicated by: Gestational HTN  Size greater than dates (normal growth [place]) Anxiety (no medications, mood stable) Abnormal glucola (unable to perform 3h GTT, HgbA1C 5.4) Uterine fibroids (2-5cm) History of preeclampsia Sickle cell trait (no FOB testing) Abnormal pap smear (ASCUS/HRHPV negative, will repeat postpartum) N/V this pregnancy IBS (asymptomatic)  Prenatal Transfer Tool  Maternal Diabetes: No Genetic Screening: Normal - Low risk female panorama, MSAFP negative Maternal Ultrasounds/Referrals: Normal Fetal Ultrasounds or other Referrals:  Other: Serial growth [place] Maternal Substance Abuse:  No Significant Maternal Medications:  None Significant Maternal Lab Results: Group B Strep negative   Prenatal Labs Blood type:  O Positive Antibody screen:  Negative CBC:  H/H 11.1/32.8 Rubella: Immune RPR:  Non-reactive Hep B:  Negative Hep C:  Negative HIV:  Negative GC/CT:  Negative Glucola:  140 (elevated), unable to perform 3h GTT (HgbA1C 5.4)  Immunizations: Tdap: Given prenatally Flu: Declines  OBHx:  OB History     Gravida  3   Para  1   Term  1   Preterm      AB  1   Living  1       SAB      IAB      Ectopic      Multiple      Live Births  1          PMHx:  See above Meds:  PNV, ASA 81mg  daily Allergy:  No Known Allergies SurgHx: None SocHx:   Denies Tobacco, ETOH, illicit drugs  O: BP (!) 156/77   Pulse 95   Temp 98.5 F (36.9 C) (Oral)   Resp 18   Ht 5\' 7"  (1.702 m)   Wt 92.5 kg   LMP 04/01/2022   SpO2 100%   BMI 31.94 kg/m  Gen. AAOx3, NAD CV.  RRR  Resp. CTAB, no wheezes/rales/rhonchi Abd. Gravid, soft, non-tender throughout, no rebound/guarding Extr.  No bilateral LE edema, no calf tenderness bilaterally SVE: 6.5/80/-1 by primary RN at 0645   Last [place] (3/11):  [redacted]w[redacted]d, EFW 3050g, 6lbs 12oz ([id number]), AAFV, Cephalic, anterior placenta, uterine fibroids anterior 4.3 and 2.1 cm, stable   FHT: 135 baseline, moderate variability, + accels,  one late decel Toco: q1-3 min   Labs: see orders  A/P:  32 y.o. [id number] @ [redacted]w[redacted]d who is admitted for labor and has ruled in for gestational HTN.  - Admit to L&D - Admit labs (CBC, T&S, RPR) - Preeclampsia labs unremarkable, PCR undetectable - May need to initiate BP medication postpartum - CEFM/Toco - FWB:  Category II (due to late deceleration in the room, overall has been reassuring otherwise) - Diet:  Clear liquids - IVF:  LR at 125cc/hour -  VTE Prophylaxis:  SCDs - GBS Status:  Negative - Presentation:  Cephalic by sutures - Pain control:  Epidural just placed - Anticipate SVD  Drema Dallas, DO

## 2023-01-03 LAB — CBC
HCT: 23.9 % — ABNORMAL LOW (ref 36.0–46.0)
Hemoglobin: 8 g/dL — ABNORMAL LOW (ref 12.0–15.0)
MCH: 24 pg — ABNORMAL LOW (ref 26.0–34.0)
MCHC: 33.5 g/dL (ref 30.0–36.0)
MCV: 71.8 fL — ABNORMAL LOW (ref 80.0–100.0)
Platelets: 189 10*3/uL (ref 150–400)
RBC: 3.33 MIL/uL — ABNORMAL LOW (ref 3.87–5.11)
RDW: 16.6 % — ABNORMAL HIGH (ref 11.5–15.5)
WBC: 18 10*3/uL — ABNORMAL HIGH (ref 4.0–10.5)
nRBC: 0 % (ref 0.0–0.2)

## 2023-01-03 MED ORDER — IBUPROFEN 600 MG PO TABS: 600.0000 mg | ORAL_TABLET | Freq: Four times a day (QID) | ORAL | 0 refills | Status: AC | PRN

## 2023-01-03 MED ORDER — FERROUS SULFATE 325 (65 FE) MG PO TABS
325.0000 mg | ORAL_TABLET | Freq: Two times a day (BID) | ORAL | Status: DC
  Administered 2023-01-03 (×2): 325 mg via ORAL
  Filled 2023-01-03 (×2): qty 1

## 2023-01-03 NOTE — Anesthesia Postprocedure Evaluation (Signed)
Anesthesia Post Note  Patient: [name]  Procedure(s) Performed: AN AD HOC LABOR EPIDURAL     Patient location during evaluation: Mother Baby Anesthesia Type: Epidural Level of consciousness: awake and alert and oriented Pain management: satisfactory to patient Vital Signs Assessment: post-procedure vital signs reviewed and stable Respiratory status: respiratory function stable Cardiovascular status: stable Postop Assessment: no headache, no backache, epidural receding, patient able to bend at knees, no signs of nausea or vomiting, adequate PO intake and able to ambulate Anesthetic complications: no   No notable events documented.  Last Vitals:  Vitals:   01/02/23 2326 01/03/23 0354  BP: 132/69 132/70  Pulse: (!) 111 99  Resp:  18  Temp: 37.2 C 37.1 C  SpO2: 100% 100%    Last Pain:  Vitals:   01/03/23 0745  TempSrc:   PainSc: 0-No pain   Pain Goal:                   Colleen Page

## 2023-01-03 NOTE — Progress Notes (Addendum)
Postpartum Note Day #1  S:  Patient doing well.  Pain controlled.  Tolerating regular diet.   Ambulating and voiding without difficulty.  Infant had to go to NICU overnight for management of high bilirubin levels. Denies fevers, chills, chest pain, SOB, N/V, or worsening bilateral LE edema.  Lochia: Minimal Infant feeding:  Bottle Circumcision:  Desires  Contraception:  IUD  O: Temp:  [98.5 F (36.9 C)-100.9 F (38.3 C)] 98.7 F (37.1 C) (03/22 0354) Pulse Rate:  [77-131] 99 (03/22 0354) Resp:  [15-18] 18 (03/22 0354) BP: (112-165)/(52-98) 132/70 (03/22 0354) SpO2:  [99 %-100 %] 100 % (03/22 0354) Gen: NAD, pleasant and cooperative CV: Regular rate Resp: Normal work of breathing Abdomen: soft, non-distended, non-tender throughout Uterus: firm, non-tender, below umbilicus Ext: No bilateral LE edema, no bilateral calf tenderness, SCDs on and working  Labs:  Recent Labs    01/02/23 0515 01/02/23 1628  HGB 10.0* 9.6*  HCT 30.0* 28.8*    A/P: Patient is a 32 y.o. [id number] PPD#1 s/p SVD.  S/p SVD - Pain well controlled  - GU: UOP is adequate - GI: Tolerating regular diet - Activity: encouraged sitting up to chair and ambulation as tolerated - DVT Prophylaxis: Ambulation, SCDs in bed - Unasyn x 24h for chorioamnionitis (Tmax/last 100.9 at 1404 on 3/21) - Labs: not yet collected this AM, oral iron ordered  Gestational HTN - Preeclampsia labs unremarkable, PCR undetectable range - Mild ranges upon admission but normotensive postpartum (130-138/69-76) - Discussed possibility of starting medication if needed - Will arrange 1 week BP check  Disposition:  D/C home likely PPD#2.   Drema Dallas, DO

## 2023-01-03 NOTE — Clinical Social Work Maternal (Signed)
CLINICAL SOCIAL WORK MATERNAL/CHILD NOTE  Patient Details  Name: Colleen Page MRN: [id number] Date of Birth: 03/08/1991  Date:  01/03/2023  Clinical Social Worker Initiating Note:  Abundio Miu, [place] Date/Time: Initiated:  01/03/23/1109     Child's Name:  Colleen Page   Biological Parents:  Mother, Father (Father: Colleen Page)   Need for Interpreter:  None   Reason for Referral:  Behavioral Health Concerns, Parental Support of Premature Babies < 32 weeks/or Critically Ill babies   Address:  [place] [address]    Phone number:  [phone] (home)     Additional phone number:   Household Members/Support Persons (HM/SP):   Household Member/Support Person 1, Household Member/Support Person 2   HM/SP Name Relationship DOB or Age  HM/SP -[place] FOB    HM/SP -2 Elzie Rings son 10/19/08  HM/SP -3        HM/SP -4        HM/SP -5        HM/SP -6        HM/SP -7        HM/SP -8          Natural Supports (not living in the home):  Extended Family, Immediate Family   Professional Supports: Therapist   Employment: Full-time   Type of Work: [place]   Education:  [place] arranged:    Museum/gallery curator Resources:  Multimedia programmer    Other Resources:  [place] Considerations Which May Impact Care:    Strengths:  Ability to meet basic needs  , Home prepared for child  , Understanding of illness, Pediatrician chosen   Psychotropic Medications:         Pediatrician:    [place] area  Pediatrician List:   [place]      Pediatrician Fax Number:    Risk Factors/Current Problems:  Mental Health Concerns     Cognitive State:  Insightful  , Able to Concentrate  , Alert  , Linear Thinking  , Goal Oriented     Mood/Affect:  Calm  , Comfortable  , Interested     CSW  Assessment: CSW met with MOB at bedside to complete psychosocial assessment, FOB present. CSW introduced self and explained role. MOB was welcoming, open, pleasant, talkative, and remained engaged during assessment. MOB reported that she resides with FOB and older son. 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 MOB reported that they have all items needed to care for infant including a car seat and bassinet. MOB reported that they have a crib that they have to pick up. MOB shared that infant has a nursery but will be sleeping in the bassinet in their room initially. CSW inquired about MOB's support system, MOB reported that FOB and both of their families are supports. MOB specifically mentioned her eldest sister and grandma being supports.   CSW and MOB discussed infant's NICU admission. CSW informed MOB about the NICU, what to expect, and supports available while infant is admitted to the NICU. MOB reported that they feel informed about infant's care. MOB shared that she prefers thorough updates as it makes her feel more comfortable. CSW asked would an update from a provider be helpful, MOB reported yes. CSW agreed to  ask a provider to follow up with MOB. MOB denied any transportation barriers with visiting infant in the NICU. CSW explained visitation policy and agreed to confirm with NICU staff most current visitation policy.    CSW asked to speak with MOB privately, FOB left the room.   CSW inquired about MOB's mental health history. MOB reported that she was diagnosed with depression and anxiety in 2021 following the loss of two family members. MOB shared that her grieving process was "rough". CSW offered condolences for MOB's loss. MOB shared that she started therapy and still participates in therapy as needed. MOB denied any current depressive symptoms and endorsed symptoms of anxiety due to infant's NICU admission. MOB reported that she had a  breakdown when infant had to go to the NICU as she felt this time would be different. MOB shared about her older son's illness and spending lots of time in the hospital until he was 34 years old. CSW acknowledged, normalized, and validated MOB's feelings due to her past experience with her son's illness. CSW and MOB discussed how her previous experience has a current impact on her anxiety. MOB denied any additional mental health history. MOB denied any history of postpartum depression. CSW inquired about how MOB was feeling overall emotionally since giving birth, MOB reported that she was feeling happy and excited. MOB shared that she had a positive labor experience. MOB presented calm and did not demonstrate any acute mental health signs/symptoms. CSW assessed for safety, MOB denied SI, HI, and domestic violence.    CSW provided education regarding the baby blues period vs. perinatal mood disorders, discussed treatment and gave resources for mental health follow up if concerns arise.  CSW recommends self-evaluation during the postpartum time period using the New Mom Checklist from Postpartum Progress and encouraged MOB to contact a medical professional if symptoms are noted at any time.    CSW provided review of Sudden Infant Death Syndrome (SIDS) precautions.    CSW will continue to offer resources/supports while infant is admitted to the NICU as MOB opted for CSW to check in weekly.   CSW requested that attending Neonatologist and or NNP follow up with MOB to provide an update.   CSW Plan/Description:  Psychosocial Support and Ongoing Assessment of Needs, Sudden Infant Death Syndrome (SIDS) Education, Perinatal Mood and Anxiety Disorder (PMADs) Education, Other Patient/Family Education    Burnis Medin, LCSW 01/03/2023, 11:12 AM

## 2023-01-03 NOTE — Discharge Summary (Signed)
Postpartum Discharge Summary  Date of Service updated 01/03/2023     Patient Name: Colleen Page DOB: 07-Jul-1991 MRN: [id number]  Date of admission: 01/02/2023 Delivery date:01/02/2023  Delivering provider: Drema Dallas  Date of discharge: 01/03/2023  Admitting diagnosis: Uterine contractions during pregnancy [O47.9] Intrauterine pregnancy: [redacted]w[redacted]d     Secondary diagnosis:  Principal Problem:   Uterine contractions during pregnancy  Additional problems: None    Discharge diagnosis: Term Pregnancy Delivered                                              Post partum procedures: None Augmentation: N/A Complications: Intrauterine Inflammation or infection (Chorioamniotis)  Hospital course: Onset of Labor With Vaginal Delivery      32 y.o. yo [id number] at [redacted]w[redacted]d was admitted in Active Labor on 01/02/2023. Labor course was complicated by chorioamnionitis   Membrane Rupture Time/Date:  ,01/02/2023   Delivery Method:Vaginal, Spontaneous  Episiotomy: None  Lacerations:  1st degree;Perineal  Patient had a postpartum course complicated by chorioamnionitis .  She is ambulating, tolerating a regular diet, passing flatus, and urinating well. Patient is discharged home in stable condition on 01/03/23.  Newborn Data: Birth date:01/02/2023  Birth time:3:34 PM  Gender:Female  Living status:Living  Apgars:9 ,9  Weight:3330 g   Magnesium Sulfate received: No BMZ received: No Rhophylac:N/A MMR:N/A T-DaP:Given prenatally Flu: N/A Transfusion:No  Physical exam  Vitals:   01/02/23 2326 01/03/23 0354 01/03/23 1444 01/03/23 1640  BP: 132/69 132/70 131/83 134/84  Pulse: (!) 111 99 97 94  Resp:  18 17 17   Temp: 99 F (37.2 C) 98.7 F (37.1 C) 97.9 F (36.6 C) 97.8 F (36.6 C)  TempSrc: Oral Oral Oral Oral  SpO2: 100% 100% 100% 96%  Weight:      Height:       General: alert, cooperative, and no distress Lochia: appropriate Uterine Fundus: firm Incision: N/A DVT Evaluation: No  evidence of DVT seen on physical exam. Labs: Lab Results  Component Value Date   WBC 18.0 (H) 01/03/2023   HGB 8.0 (L) 01/03/2023   HCT 23.9 (L) 01/03/2023   MCV 71.8 (L) 01/03/2023   PLT 189 01/03/2023      Latest Ref Rng & Units 01/02/2023    5:15 AM  CMP  Glucose 70 - 99 mg/dL 98   BUN 6 - 20 mg/dL <5   Creatinine 0.44 - 1.00 mg/dL 0.72   Sodium 135 - 145 mmol/L 134   Potassium 3.5 - 5.1 mmol/L 4.2   Chloride 98 - 111 mmol/L 106   CO2 22 - 32 mmol/L 21   Calcium 8.9 - 10.3 mg/dL 8.6   Total Protein 6.5 - 8.1 g/dL 6.2   Total Bilirubin 0.3 - 1.2 mg/dL 0.4   Alkaline Phos 38 - 126 U/L 113   AST 15 - 41 U/L 16   ALT 0 - 44 U/L 13    Edinburgh Score:    01/03/2023   10:02 AM  Edinburgh Postnatal Depression Scale Screening Tool  I have been able to laugh and see the funny side of things. 0  I have looked forward with enjoyment to things. 0  I have blamed myself unnecessarily when things went wrong. 2  I have been anxious or worried for no good reason. 1  I have felt scared or panicky for no good reason. 2  Things have been getting on top of me. 1  I have been so unhappy that I have had difficulty sleeping. 1  I have felt sad or miserable. 1  I have been so unhappy that I have been crying. 1  The thought of harming myself has occurred to me. 0  Edinburgh Postnatal Depression Scale Total 9      After visit meds:  Allergies as of 01/03/2023   No Known Allergies      Medication List     STOP taking these medications    promethazine 25 MG tablet Commonly known as: PHENERGAN       TAKE these medications    calcium carbonate 500 MG chewable tablet Commonly known as: TUMS - dosed in mg elemental calcium Chew 1 tablet by mouth daily.   famotidine 10 MG tablet Commonly known as: PEPCID Take 1 tablet (10 mg total) by mouth 2 (two) times daily.   ibuprofen 600 MG tablet Commonly known as: ADVIL Take 1 tablet (600 mg total) by mouth every 6 (six) hours as  needed.   metoCLOPramide 10 MG tablet Commonly known as: REGLAN Take 1 tablet (10 mg total) by mouth every 6 (six) hours.   ondansetron 8 MG disintegrating tablet Commonly known as: ZOFRAN-ODT Take 1 tablet (8 mg total) by mouth every 8 (eight) hours as needed for nausea or vomiting.   pantoprazole 40 MG tablet Commonly known as: PROTONIX Take 1 tablet (40 mg total) by mouth 2 (two) times daily.   prenatal multivitamin Tabs tablet Take 1 tablet by mouth daily at 12 noon.         Discharge home in stable condition Infant Feeding: Breast Infant Disposition:NICU Discharge instruction: per After Visit Summary and Postpartum booklet. Activity: Advance as tolerated. Pelvic rest for 6 weeks.  Diet: routine diet Anticipated Birth Control: Unsure Postpartum Appointment:6 weeks Additional Postpartum F/U:  None Future Appointments:No future appointments. Follow up Visit:  Follow-up Information     Drema Dallas, DO. Schedule an appointment as soon as possible for a visit in 6 week(s).   Specialty: Obstetrics and Gynecology Contact information: [address] [place] [address] [phone]                     01/03/2023 Christophe Louis, MD

## 2023-01-03 NOTE — Lactation Note (Signed)
This note was copied from a baby's chart.  NICU Lactation Consultation Note  Patient Name: Colleen Page [id number] Date: 01/03/2023 Age:34 hours  Reason for consult: Other (Comment); Initial assessment; NICU baby; Term (DAT (+))  Subjective Visited with family of 34 hours old FT NICU female; Colleen Page is a P2 but not very experienced breastfeeding, her first son is now 37 y.o; he was also a former NICU baby. Set her up with a DEBP and initiated pumping during [facility] consultation, praised her for her efforts. Explained that the importance of pumping this early on is mainly for breast stimulation and not to get volume. Patient voiced she might be getting discharged today. Reviewed pumping schedule, pump settings, lactogenesis II, newborn jaundice, discharge education, engorgement prevention/treatment and anticipatory guidelines.   Objective Infant data: Mother's Current Feeding Choice: Breast Milk and Formula  Infant feeding assessment Scale for Readiness: 1 Scale for Quality: 2  Maternal data: [id number]  Vaginal, Spontaneous Has patient been taught Hand Expression?: Yes Hand Expression Comments: colostrum noted Significant Breast History:: (+) breast changes during the pregnancy Current breast feeding challenges:: NICU admission Previous breastfeeding challenges?: Exclusive pump and bottle fed (NICU stay with baby # 1 due to kidney problems) Does the patient have breastfeeding experience prior to this delivery?: Yes How long did the patient breastfeed?: 2 weeks Pumping frequency: initiated pumping at 23 hours of life Pumped volume: 0 mL Flange Size: 21 Risk factor for low milk supply:: infant separation  Pump: Personal (hand pump and Medela DEBP at home)  Assessment Infant: Feeding Status: Scheduled 8-11-2-5  Maternal: Milk volume: Normal  Intervention/Plan Interventions: Interventions: Breast feeding basics reviewed; DEBP; Education; [employer] brochure  Discharge  Education: Engorgement and breast care  Tools: Pump; Flanges; Coconut oil Pump Education: Setup, frequency, and cleaning; Milk Storage  Plan: Encouraged pumping every 3 hours, ideally 8 pumping sessions/24 hours Breast massage, hand expression and coconut oil were also encouraged prior pumping She'll take all pump parts to baby's room after her discharge She'll change her pump settings from initiation to expression mode after she starts getting 20 ml of EBM combined  FOB and female visitors present and supportive. All questions and concerns answered, family to contact [facility] services PRN.  Consult Status: NICU follow-up  NICU Follow-up type: Verify onset of copious milk; Verify absence of engorgement   Colleen Page 01/03/2023, 3:37 PM

## 2023-01-05 ENCOUNTER — Encounter (HOSPITAL_COMMUNITY): Payer: Self-pay | Admitting: Obstetrics and Gynecology

## 2023-01-05 ENCOUNTER — Inpatient Hospital Stay (HOSPITAL_COMMUNITY)
Admission: AD | Admit: 2023-01-05 | Discharge: 2023-01-05 | Disposition: A | Discharge status: Home / Self Care | Payer: BC Managed Care – PPO | Attending: Obstetrics and Gynecology | Admitting: Obstetrics and Gynecology

## 2023-01-05 ENCOUNTER — Ambulatory Visit (HOSPITAL_COMMUNITY): Payer: Self-pay

## 2023-01-05 ENCOUNTER — Other Ambulatory Visit: Payer: Self-pay

## 2023-01-05 DIAGNOSIS — Z86018 Personal history of other benign neoplasm: Secondary | ICD-10-CM | POA: Diagnosis not present

## 2023-01-05 DIAGNOSIS — O9089 Other complications of the puerperium, not elsewhere classified: Secondary | ICD-10-CM | POA: Diagnosis not present

## 2023-01-05 DIAGNOSIS — R030 Elevated blood-pressure reading, without diagnosis of hypertension: Secondary | ICD-10-CM | POA: Diagnosis not present

## 2023-01-05 DIAGNOSIS — D259 Leiomyoma of uterus, unspecified: Secondary | ICD-10-CM

## 2023-01-05 DIAGNOSIS — O165 Unspecified maternal hypertension, complicating the puerperium: Secondary | ICD-10-CM

## 2023-01-05 HISTORY — DX: Leiomyoma of uterus, unspecified: D25.9

## 2023-01-05 HISTORY — DX: Leiomyoma of uterus, unspecified: O34.10

## 2023-01-05 HISTORY — DX: Gestational (pregnancy-induced) hypertension without significant proteinuria, unspecified trimester: O13.9

## 2023-01-05 LAB — COMPREHENSIVE METABOLIC PANEL
ALT: 19 U/L (ref 0–44)
AST: 22 U/L (ref 15–41)
Albumin: 2.5 g/dL — ABNORMAL LOW (ref 3.5–5.0)
Alkaline Phosphatase: 80 U/L (ref 38–126)
Anion gap: 10 (ref 5–15)
BUN: <5 mg/dL — ABNORMAL LOW (ref 6–20)
CO2: 22 mmol/L (ref 22–32)
Calcium: 8.5 mg/dL — ABNORMAL LOW (ref 8.9–10.3)
Chloride: 107 mmol/L (ref 98–111)
Creatinine, Ser: 0.8 mg/dL (ref 0.44–1.00)
GFR, Estimated: >60 mL/min (ref >60)
Glucose, Bld: 102 mg/dL — ABNORMAL HIGH (ref 70–99)
Potassium: 3.2 mmol/L — ABNORMAL LOW (ref 3.5–5.1)
Sodium: 139 mmol/L (ref 135–145)
Total Bilirubin: 0.3 mg/dL (ref 0.3–1.2)
Total Protein: 6 g/dL — ABNORMAL LOW (ref 6.5–8.1)

## 2023-01-05 LAB — CBC
HCT: 30.3 % — ABNORMAL LOW (ref 36.0–46.0)
Hemoglobin: 9.7 g/dL — ABNORMAL LOW (ref 12.0–15.0)
MCH: 23.7 pg — ABNORMAL LOW (ref 26.0–34.0)
MCHC: 32 g/dL (ref 30.0–36.0)
MCV: 74.1 fL — ABNORMAL LOW (ref 80.0–100.0)
Platelets: 257 10*3/uL (ref 150–400)
RBC: 4.09 MIL/uL (ref 3.87–5.11)
RDW: 16.9 % — ABNORMAL HIGH (ref 11.5–15.5)
WBC: 10.6 10*3/uL — ABNORMAL HIGH (ref 4.0–10.5)
nRBC: 0 % (ref 0.0–0.2)

## 2023-01-05 MED ORDER — NIFEDIPINE ER 30 MG PO TB24: 30.0000 mg | ORAL_TABLET | Freq: Every day | ORAL | 4 refills | Status: AC

## 2023-01-05 MED ORDER — FUROSEMIDE 20 MG PO TABS: 20.0000 mg | ORAL_TABLET | Freq: Every day | ORAL | 0 refills | Status: AC

## 2023-01-05 MED ORDER — NIFEDIPINE ER OSMOTIC RELEASE 30 MG PO TB24
30.0000 mg | ORAL_TABLET | Freq: Once | ORAL | Status: AC
  Administered 2023-01-05: 30 mg via ORAL
  Filled 2023-01-05: qty 1

## 2023-01-05 MED ORDER — FUROSEMIDE 20 MG PO TABS
20.0000 mg | ORAL_TABLET | Freq: Once | ORAL | Status: AC
  Administered 2023-01-05: 20 mg via ORAL
  Filled 2023-01-05: qty 1

## 2023-01-05 NOTE — MAU Provider Note (Signed)
History     CSN: [id number]  Arrival date and time: 01/05/23 1045   Event Date/Time   First Provider Initiated Contact with Patient 01/05/23 1123      Chief Complaint  Patient presents with   Swelling   Lump in [place]  is a 32 y.o. [id number] at 3 Days Postpartum who receives care at [facility].  She presents today for swelling and abdominal lump.  She states she has known history of fibroids and states that she has been noting a "bulge on her right side that was present prior to delivery."  She states the area is more prevalent now and is tender.  Patient questions if it could be a fibroid.  Patient also reports some increased swelling that she noted this morning.  She states her blood pressures were "sketchy" throughout the pregnancy an she is scheduled for a BP check on Thursday.  Patient denies HA, but reports left side pressure in her eye area.  She states the pressure is improved with sleep and worsened with looking at her phone and talking too much!  Patient states her bleeding is "pretty good" and denies nausea, vomiting, or issues with urination/bowel movements.  She reports her infant is in the NICU and she is pumping.    OB History     Gravida  3   Para  2   Term  2   Preterm      AB  1   Living  2      SAB      IAB      Ectopic      Multiple  0   Live Births  2           Past Medical History:  Diagnosis Date   Anemia    Gastritis    GERD (gastroesophageal reflux disease)    H. pylori infection    Pregnancy induced hypertension    Uterine fibroids affecting pregnancy     Past Surgical History:  Procedure Laterality Date   NO PAST SURGERIES      History reviewed. No pertinent family history.  Social History   Tobacco Use   Smoking status: Former    Packs/day: .5    Types: Cigarettes    Passive exposure: Never   Smokeless tobacco: Never  Vaping Use   Vaping Use: Former  Substance Use Topics   Alcohol use: Not  Currently   Drug use: No    Allergies: No Known Allergies  Medications Prior to Admission  Medication Sig Dispense Refill Last Dose   calcium carbonate (TUMS - DOSED IN MG ELEMENTAL CALCIUM) 500 MG chewable tablet Chew 1 tablet by mouth daily.   Past Week   ibuprofen (ADVIL) 600 MG tablet Take 1 tablet (600 mg total) by mouth every 6 (six) hours as needed. 30 tablet 0 01/05/2023   metoCLOPramide (REGLAN) 10 MG tablet Take 1 tablet (10 mg total) by mouth every 6 (six) hours. 30 tablet 0 Past Week   ondansetron (ZOFRAN-ODT) 8 MG disintegrating tablet Take 1 tablet (8 mg total) by mouth every 8 (eight) hours as needed for nausea or vomiting. 20 tablet 0 Past Week   Prenatal Vit-Fe Fumarate-FA (PRENATAL MULTIVITAMIN) TABS tablet Take 1 tablet by mouth daily at 12 noon.   01/05/2023   famotidine (PEPCID) 10 MG tablet Take 1 tablet (10 mg total) by mouth 2 (two) times daily. 60 tablet 0    pantoprazole (PROTONIX) 40 MG tablet Take 1  tablet (40 mg total) by mouth 2 (two) times daily. 60 tablet 0     Review of Systems  Gastrointestinal:  Negative for nausea and vomiting.  Genitourinary:  Positive for vaginal bleeding. Negative for difficulty urinating, dysuria and vaginal discharge.   Physical Exam   Blood pressure 132/80, pulse 99, temperature 98.8 F (37.1 C), temperature source Oral, resp. rate 18, height 5\' 7"  (1.702 m), weight 91.2 kg, SpO2 100 %, unknown if currently breastfeeding.  Physical Exam Vitals reviewed.  Constitutional:      General: She is not in acute distress.    Appearance: Normal appearance. She is not toxic-appearing.  HENT:     Head: Normocephalic and atraumatic.  Eyes:     Conjunctiva/sclera: Conjunctivae normal.  Cardiovascular:     Rate and Rhythm: Normal rate.  Pulmonary:     Effort: Pulmonary effort is normal. No respiratory distress.  Abdominal:     Tenderness: There is abdominal tenderness (Appropriately).     Comments: Soft, Fundus Firm Suspected fibroid  palpated on fundus at top right corner. Palpates ~3.5cm, mildly tender.   Musculoskeletal:        General: Normal range of motion.     Cervical back: Normal range of motion.     Right lower leg: Edema (No pitting) present.     Left lower leg: Edema (No pitting) present.  Skin:    General: Skin is warm and dry.  Neurological:     Mental Status: She is alert and oriented to person, place, and time.  Psychiatric:        Mood and Affect: Mood normal.        Behavior: Behavior normal.     MAU Course  Procedures Results for orders placed or performed during the hospital encounter of 01/05/23 (from the past 24 hour(s))  CBC     Status: Abnormal   Collection Time: 01/05/23 11:26 AM  Result Value Ref Range   WBC 10.6 (H) 4.0 - 10.5 K/uL   RBC 4.09 3.87 - 5.11 MIL/uL   Hemoglobin 9.7 (L) 12.0 - 15.0 g/dL   HCT 30.3 (L) 36.0 - 46.0 %   MCV 74.1 (L) 80.0 - 100.0 fL   MCH 23.7 (L) 26.0 - 34.0 pg   MCHC 32.0 30.0 - 36.0 g/dL   RDW 16.9 (H) 11.5 - 15.5 %   Platelets 257 150 - 400 K/uL   nRBC 0.0 0.0 - 0.2 %  Comprehensive metabolic panel     Status: Abnormal   Collection Time: 01/05/23 11:26 AM  Result Value Ref Range   Sodium 139 135 - 145 mmol/L   Potassium 3.2 (L) 3.5 - 5.1 mmol/L   Chloride 107 98 - 111 mmol/L   CO2 22 22 - 32 mmol/L   Glucose, Bld 102 (H) 70 - 99 mg/dL   BUN <5 (L) 6 - 20 mg/dL   Creatinine, Ser 0.80 0.44 - 1.00 mg/dL   Calcium 8.5 (L) 8.9 - 10.3 mg/dL   Total Protein 6.0 (L) 6.5 - 8.1 g/dL   Albumin 2.5 (L) 3.5 - 5.0 g/dL   AST 22 15 - 41 U/L   ALT 19 0 - 44 U/L   Alkaline Phosphatase 80 38 - 126 U/L   Total Bilirubin 0.3 0.3 - 1.2 mg/dL   GFR, Estimated >60 >60 mL/min   Anion gap 10 5 - 15    MDM Physical Exam Labs: CBC, CMP, PC Ratio Measure BPQ15 min EFM Antihypertensive Diuretic Prescription Assessment and Plan  32 year old Postpartum Elevated BP Uterine Fibroids  -POC Reviewed. -Exam performed and findings discussed. -Reassured that  appropriate to have some tenderness, after delivery, with palpable fibroids. -Informed of elevated blood pressures and recommendation for Procardia and Lasix.  -Labs ordered.  -Reassured that swelling is as anticipated. No pitting. -Monitor and await results.   Maryann Conners 01/05/2023, 11:23 AM   Reassessment (12:37 PM) Vitals:   01/05/23 1140 01/05/23 1145 01/05/23 1200 01/05/23 1215  BP:  (!) 146/80 (!) 144/84 138/79  Pulse:  90 84 83  Resp:      Temp:      TempSrc:      SpO2: 100%     Weight:      Height:        -Results as above -Blood pressures improving. -Discussed home mgmt and follow up. -Rx for Lasix and Procardia sent to pharmacy on file.  -Precautions given. -Encouraged to call primary office or return to MAU if symptoms worsen or with the onset of new symptoms. -Discharged to home in stable condition.  Maryann Conners MSN, CNM Advanced Practice Provider, [facility] [employer] [facility]

## 2023-01-05 NOTE — Lactation Note (Signed)
This note was copied from a baby's chart.  NICU Lactation Consultation Note  Patient Name: Colleen Page [id number] Date: 01/05/2023 Age:34 hours  Reason for consult: Follow-up assessment; Mother's request; RN request; NICU baby; Hyperbilirubinemia; Other (Comment); Term (DAT (+))  Subjective Visited with family of 34 hours old FT NICU female; [name] is a P2 but not very experienced breastfeeding; her first son is now 34 years old, he is also a NICU graduate. She called out for lactation because she had questions regarding pumping. She voiced her milk is slowing coming in to volume but that her breast still feel heavy after pumping; noticed that she's skipping a few pumping session. Explained the importance of continuous pumping for the onset of lactogenesis II and the prevention of engorgement. Asked her to switch her pump settings and use moist heat to help with let down. She also endorses breast changes, she said that when she showers the milk flows so easily but when she's not in the shower and it's time to pump she has difficulty to establish a let-down. Reviewed pumping schedule, pump settings and anticipatory guidelines.  Objective Infant data: Mother's Current Feeding Choice: Breast Milk and Donor Milk  Infant feeding assessment Scale for Readiness: 1 Scale for Quality: 2  Maternal data: [id number]  Vaginal, Spontaneous Pumping frequency: 5-6 times/24 hours Pumped volume: 12 mL Flange Size: 21 Pump: Personal (hand pump and Medela DEBP at home)  Assessment Infant: Feeding Status: Scheduled 8-11-2-5 No S/S of engorgement at this time but breasts are filling in  Maternal: Milk volume: Normal  Intervention/Plan Interventions: Interventions: Breast feeding basics reviewed; DEBP; Education Tools: Pump; Flanges; Coconut oil Pump Education: Setup, frequency, and cleaning; Milk Storage  Plan: Encouraged pumping every 3 hours, ideally 8 pumping sessions/24 hours She'll  change her pump settings from initiation to expression mode and will use moist heat to mimic "she shower effect" she described during consult   FOB present and supportive. All questions and concerns answered, family to contact [facility] services PRN.  Consult Status: NICU follow-up  NICU Follow-up type: Verify absence of engorgement; Weekly NICU follow up   [place] 01/05/2023, 6:54 PM

## 2023-01-05 NOTE — MAU Note (Signed)
Colleen Page is a 32 y.o. at Unknown here in MAU reporting: she's noticed an increase swelling in bilateral ankles and she's concerned about a lump that's located in the RLQ of her abdomen.  States has a Hx of fibroids, unsure if lump is a fibroid.  States lump is sore and appears to be gradually getting bigger, noticeable when laying down. S/P NVD 01/02/23 LMP: NA Onset of complaint: today Pain score: 3 Vitals:   01/05/23 1106  BP: (!) 155/86  Pulse: 95  Resp: 18  Temp: 98.8 F (37.1 C)  SpO2: 100%     FHT:NA Lab orders placed from triage:   None

## 2023-01-06 LAB — SURGICAL PATHOLOGY

## 2023-01-12 ENCOUNTER — Other Ambulatory Visit: Payer: Self-pay

## 2023-01-15 ENCOUNTER — Telehealth (HOSPITAL_COMMUNITY): Payer: Self-pay | Admitting: *Deleted

## 2023-01-15 NOTE — Telephone Encounter (Signed)
Mom reports feeling good. No concerns about herself at this time. EPDS=5 [facility] score=9) Mom reports baby is doing well. Feeding, peeing, and pooping without difficulty. Safe sleep reviewed. Mom reports no concerns about baby at present.  Odis Hollingshead, RN 01-15-2023 at 3:38pm

## 2023-01-16 ENCOUNTER — Telehealth (HOSPITAL_COMMUNITY): Payer: Self-pay

## 2023-01-16 NOTE — Telephone Encounter (Signed)
Preadmission testing 

## 2023-03-26 ENCOUNTER — Other Ambulatory Visit: Payer: Self-pay

## 2023-04-12 IMAGING — CT CT HEAD W/O CM
3 series · 14 of 47 positions shown, 16 images · non-contrast
Comparison: None.

CLINICAL DATA: Neuro deficit, acute, stroke suspected



[Series 2: head wo · axial · 0.45mm/px · z∈[+1202,+1342]mm · 8 of 34 slices shown, 10 images]
[im 3/34  brain]
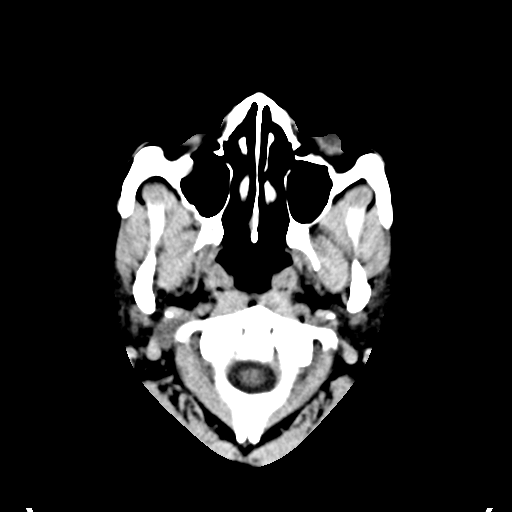
[im 3/34  bone]
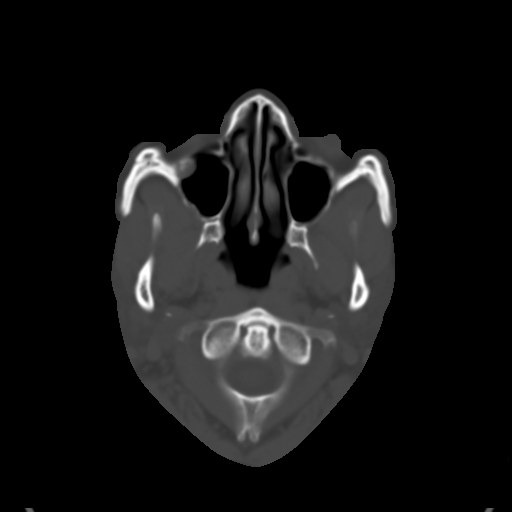
[im 7/34  brain]
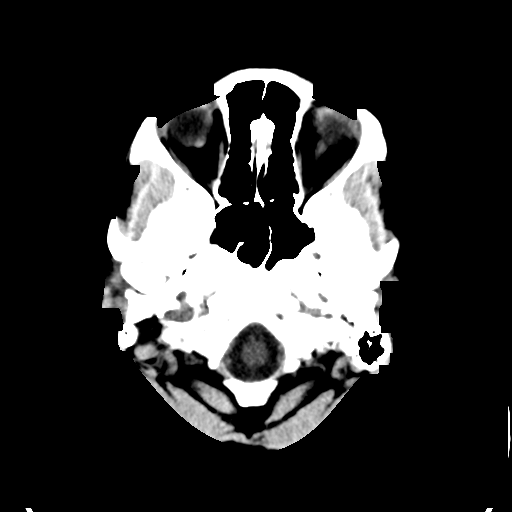
[im 11/34  brain]
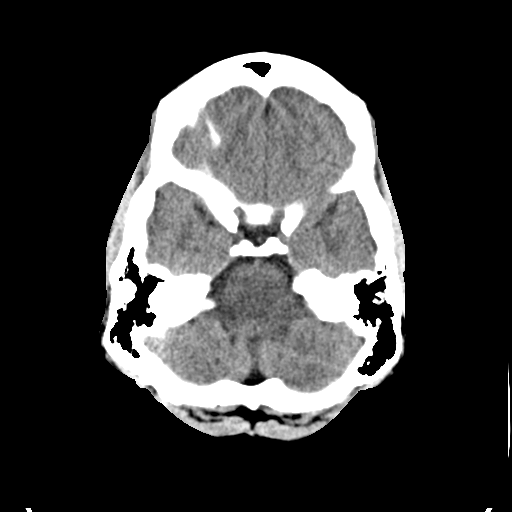
[im 15/34  brain]
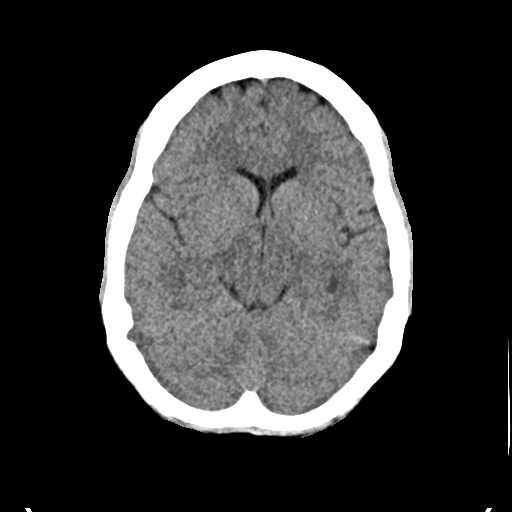
[im 19/34  brain]
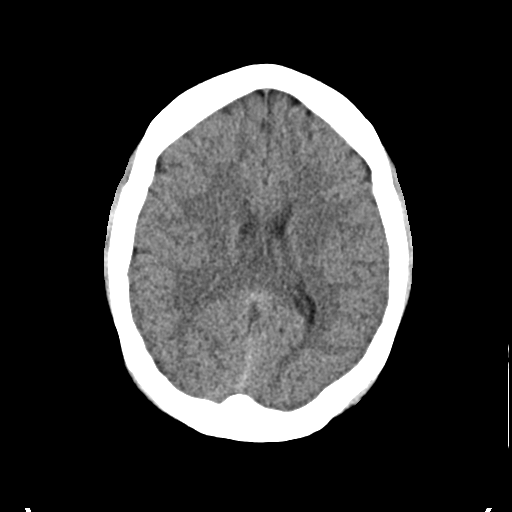
[im 19/34  bone]
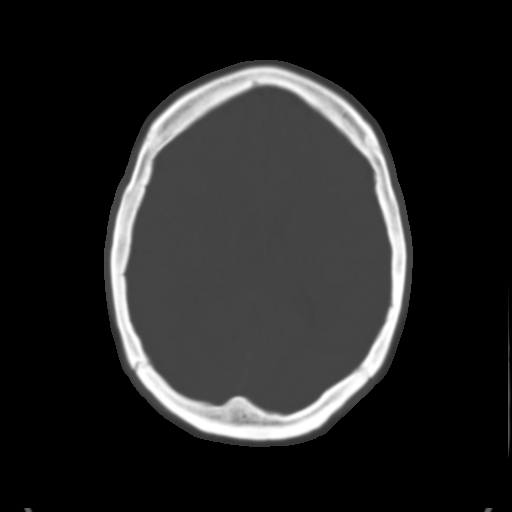
[im 23/34  brain]
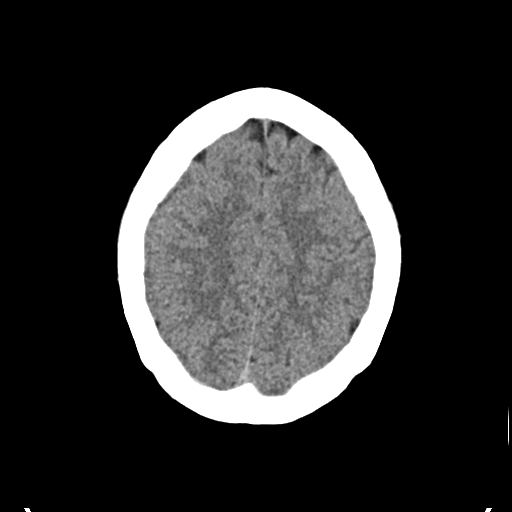
[im 27/34  brain]
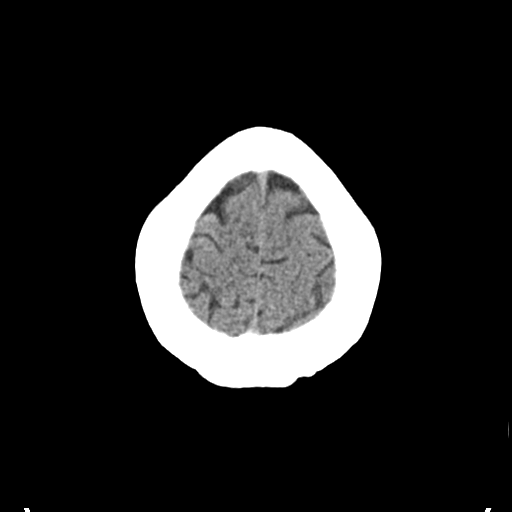
[im 31/34  brain]
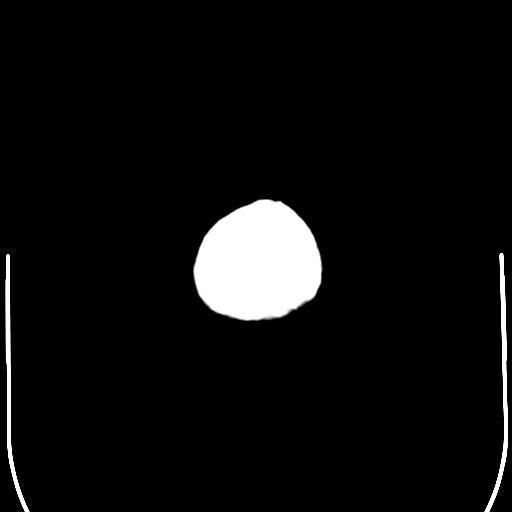

[Series 4: coronal soft · coronal · 0.33mm/px · 3 of 67 slices shown]
[im 23/67  brain]
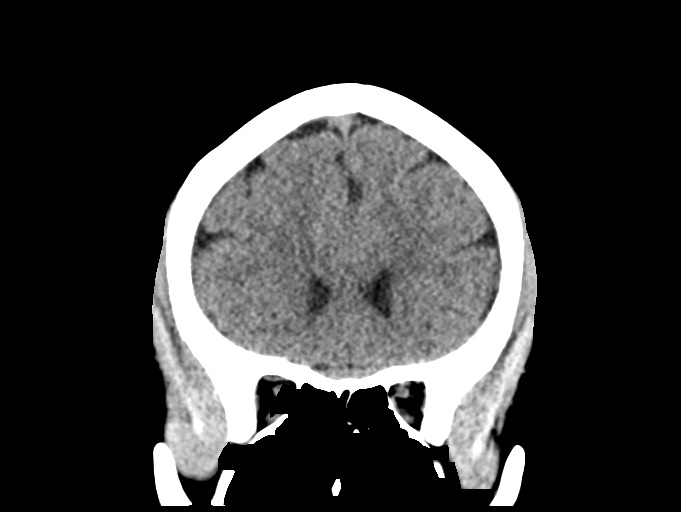
[im 30/67  brain]
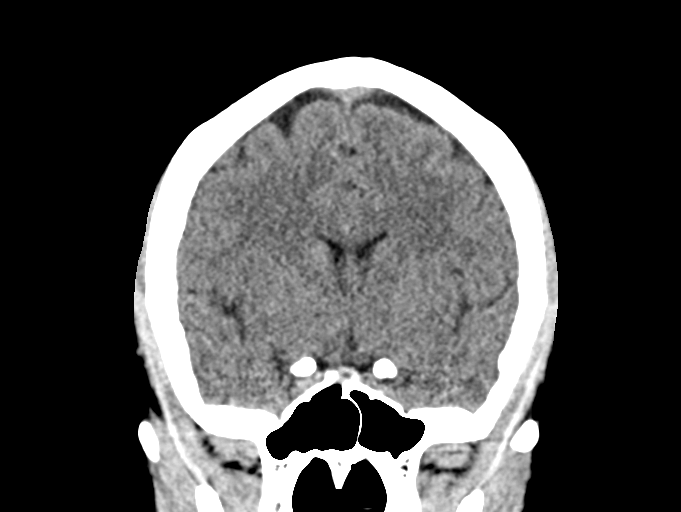
[im 37/67  brain]
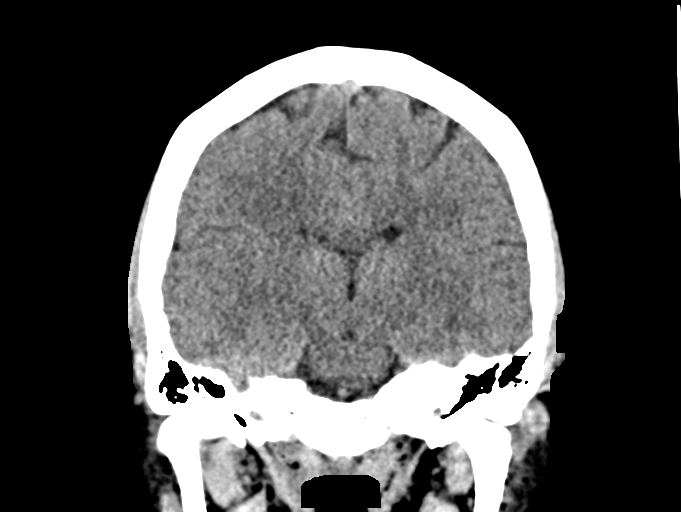

[Series 5: sag soft · sagittal · 0.33mm/px · 3 of 67 slices shown]
[im 23/67  brain]
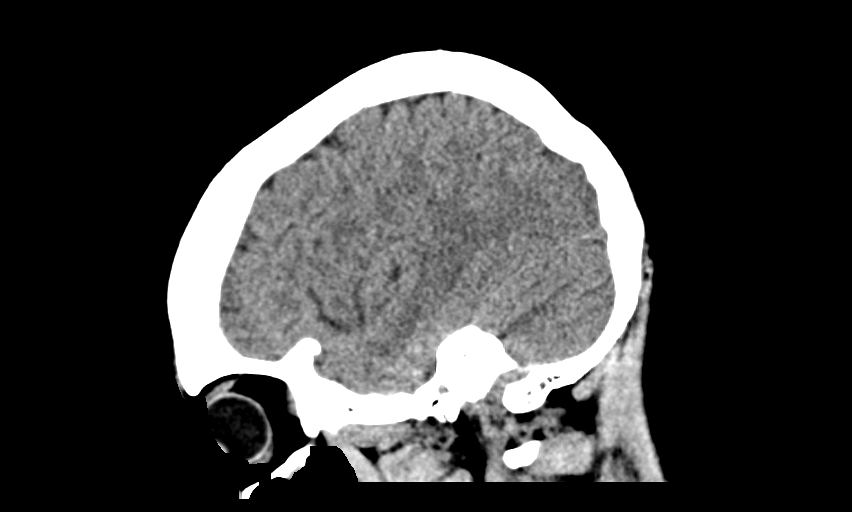
[im 34/67  brain]
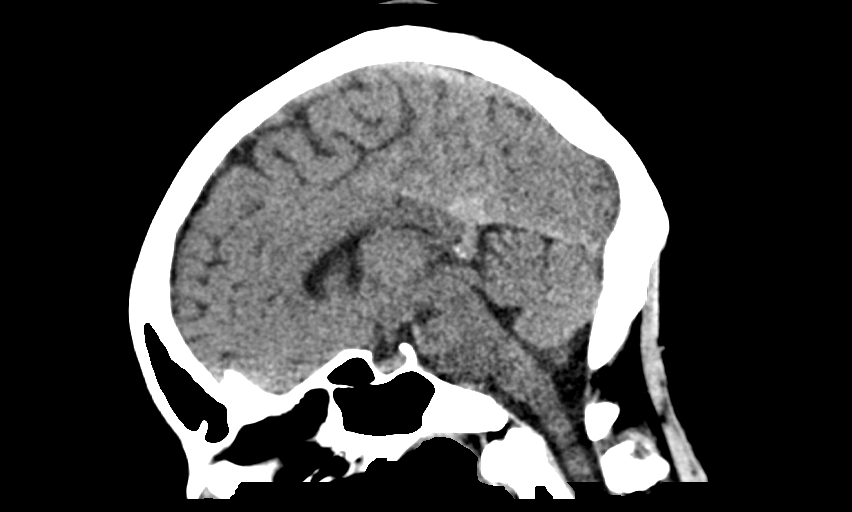
[im 45/67  brain]
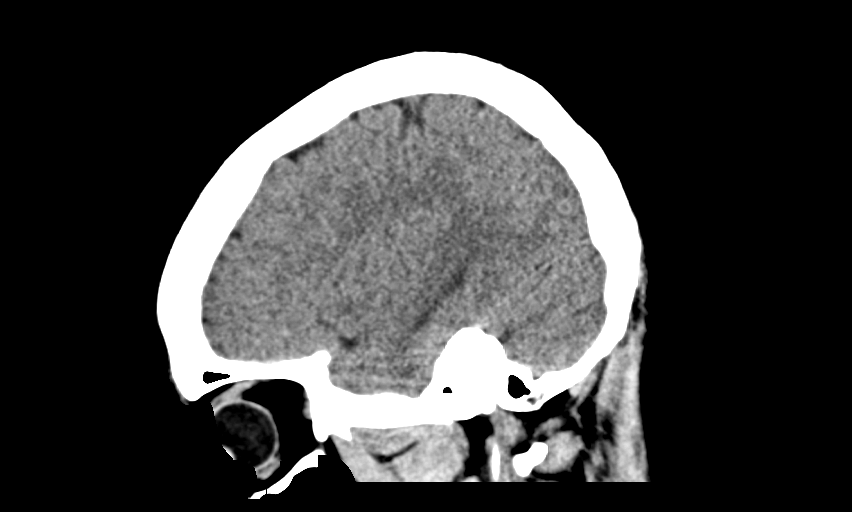

[14 of 47 positions shown; findings below may reference images not displayed]

FINDINGS: Brain:

No evidence of large-territorial acute infarction. No parenchymal
hemorrhage. No mass lesion. No extra-axial collection.

No mass effect or midline shift. No hydrocephalus. Basilar cisterns
are patent.

Vascular: No hyperdense vessel.

Skull: No acute fracture or focal lesion.

Sinuses/Orbits: Paranasal sinuses and mastoid air cells are clear.
The orbits are unremarkable.

Other: None.
IMPRESSION: No acute intracranial abnormality.

## 2023-04-16 DIAGNOSIS — Z3202 Encounter for pregnancy test, result negative: Secondary | ICD-10-CM | POA: Diagnosis not present

## 2023-04-16 DIAGNOSIS — Z30430 Encounter for insertion of intrauterine contraceptive device: Secondary | ICD-10-CM | POA: Diagnosis not present

## 2023-05-10 ENCOUNTER — Encounter (HOSPITAL_COMMUNITY): Payer: Self-pay

## 2023-05-10 ENCOUNTER — Emergency Department (HOSPITAL_COMMUNITY)
Admission: EM | Admit: 2023-05-10 | Discharge: 2023-05-10 | Disposition: A | Discharge status: Home / Self Care | Payer: BC Managed Care – PPO | Source: Home / Self Care | Attending: Emergency Medicine | Admitting: Emergency Medicine

## 2023-05-10 ENCOUNTER — Emergency Department (HOSPITAL_COMMUNITY): Payer: BC Managed Care – PPO

## 2023-05-10 ENCOUNTER — Other Ambulatory Visit: Payer: Self-pay

## 2023-05-10 DIAGNOSIS — R109 Unspecified abdominal pain: Secondary | ICD-10-CM | POA: Diagnosis not present

## 2023-05-10 DIAGNOSIS — R1031 Right lower quadrant pain: Secondary | ICD-10-CM | POA: Insufficient documentation

## 2023-05-10 DIAGNOSIS — R1084 Generalized abdominal pain: Secondary | ICD-10-CM | POA: Diagnosis not present

## 2023-05-10 DIAGNOSIS — I10 Essential (primary) hypertension: Secondary | ICD-10-CM | POA: Diagnosis not present

## 2023-05-10 DIAGNOSIS — K7689 Other specified diseases of liver: Secondary | ICD-10-CM | POA: Diagnosis not present

## 2023-05-10 DIAGNOSIS — D259 Leiomyoma of uterus, unspecified: Secondary | ICD-10-CM | POA: Diagnosis not present

## 2023-05-10 LAB — COMPREHENSIVE METABOLIC PANEL
ALT: 13 U/L (ref 0–44)
AST: 12 U/L — ABNORMAL LOW (ref 15–41)
Albumin: 3.7 g/dL (ref 3.5–5.0)
Alkaline Phosphatase: 78 U/L (ref 38–126)
Anion gap: 8 (ref 5–15)
BUN: 14 mg/dL (ref 6–20)
CO2: 22 mmol/L (ref 22–32)
Calcium: 8.2 mg/dL — ABNORMAL LOW (ref 8.9–10.3)
Chloride: 107 mmol/L (ref 98–111)
Creatinine, Ser: 0.74 mg/dL (ref 0.44–1.00)
GFR, Estimated: >60 mL/min (ref >60)
Glucose, Bld: 102 mg/dL — ABNORMAL HIGH (ref 70–99)
Potassium: 3.4 mmol/L — ABNORMAL LOW (ref 3.5–5.1)
Sodium: 137 mmol/L (ref 135–145)
Total Bilirubin: 0.4 mg/dL (ref 0.3–1.2)
Total Protein: 7.1 g/dL (ref 6.5–8.1)

## 2023-05-10 LAB — CBC WITH DIFFERENTIAL/PLATELET
Abs Immature Granulocytes: 0.02 10*3/uL (ref 0.00–0.07)
Basophils Absolute: 0 10*3/uL (ref 0.0–0.1)
Basophils Relative: 0 %
Eosinophils Absolute: 0 10*3/uL (ref 0.0–0.5)
Eosinophils Relative: 0 %
HCT: 37.4 % (ref 36.0–46.0)
Hemoglobin: 12.1 g/dL (ref 12.0–15.0)
Immature Granulocytes: 0 %
Lymphocytes Relative: 14 %
Lymphs Abs: 1.2 10*3/uL (ref 0.7–4.0)
MCH: 24.5 pg — ABNORMAL LOW (ref 26.0–34.0)
MCHC: 32.4 g/dL (ref 30.0–36.0)
MCV: 75.7 fL — ABNORMAL LOW (ref 80.0–100.0)
Monocytes Absolute: 0.3 10*3/uL (ref 0.1–1.0)
Monocytes Relative: 4 %
Neutro Abs: 6.6 10*3/uL (ref 1.7–7.7)
Neutrophils Relative %: 82 %
Platelets: 282 10*3/uL (ref 150–400)
RBC: 4.94 MIL/uL (ref 3.87–5.11)
RDW: 14.6 % (ref 11.5–15.5)
WBC: 8.2 10*3/uL (ref 4.0–10.5)
nRBC: 0 % (ref 0.0–0.2)

## 2023-05-10 LAB — HCG, SERUM, QUALITATIVE: Preg, Serum: NEGATIVE

## 2023-05-10 LAB — LIPASE, BLOOD: Lipase: 34 U/L (ref 11–51)

## 2023-05-10 MED ORDER — KETOROLAC TROMETHAMINE 30 MG/ML IJ SOLN
15.0000 mg | Freq: Once | INTRAMUSCULAR | Status: AC
  Administered 2023-05-10: 15 mg via INTRAVENOUS
  Filled 2023-05-10: qty 1

## 2023-05-10 MED ORDER — IOHEXOL 300 MG/ML  SOLN
100.0000 mL | Freq: Once | INTRAMUSCULAR | Status: AC | PRN
  Administered 2023-05-10: 100 mL via INTRAVENOUS

## 2023-05-10 MED ORDER — HYDROMORPHONE HCL 1 MG/ML IJ SOLN
1.0000 mg | Freq: Once | INTRAMUSCULAR | Status: AC
  Administered 2023-05-10: 1 mg via INTRAVENOUS
  Filled 2023-05-10: qty 1

## 2023-05-10 MED ORDER — SODIUM CHLORIDE (PF) 0.9 % IJ SOLN
INTRAMUSCULAR | Status: AC
  Filled 2023-05-10: qty 50

## 2023-05-10 MED ORDER — SODIUM CHLORIDE 0.9 % IV BOLUS
1000.0000 mL | Freq: Once | INTRAVENOUS | Status: AC
  Administered 2023-05-10: 1000 mL via INTRAVENOUS

## 2023-05-10 MED ORDER — ONDANSETRON HCL 4 MG/2ML IJ SOLN
4.0000 mg | Freq: Once | INTRAMUSCULAR | Status: AC
  Administered 2023-05-10: 4 mg via INTRAVENOUS
  Filled 2023-05-10: qty 2

## 2023-05-10 NOTE — ED Provider Notes (Signed)
[place] EMERGENCY DEPARTMENT AT [facility] Provider Note   CSN: [id number] Arrival date & time: 05/10/23  4098     History  Chief Complaint  Patient presents with   Abdominal Pain   HPI Colleen Page is a 32 y.o. female with GERD, uterine fibroids, and anemia presenting for abdominal pain.  Started acutely this morning around 4:30 AM.  Located around the umbilicus and radiates down to the right lower quadrant.  Feels like a sharp burning pain.  Endorses nausea and 1 occurrence of emesis.  Output is nonbloody.  Denies urinary changes but states she did "wet herself" which she thought was unusual.  States she had an IUD placed last month and since then she has had light persistent vaginal bleeding but otherwise no abnormal vaginal discharge.  Denies fever at home.  Also mention that she picked something up at work that was heavy yesterday and felt something in her right groin.    Abdominal Pain      Home Medications Prior to Admission medications   Medication Sig Start Date End Date Taking? Authorizing Provider  calcium carbonate (TUMS - DOSED IN MG ELEMENTAL CALCIUM) 500 MG chewable tablet Chew 1 tablet by mouth daily.    [provider]  furosemide (LASIX) 20 MG tablet Take 1 tablet (20 mg total) by mouth daily. 01/05/23   Gerrit Heck, CNM  ibuprofen (ADVIL) 600 MG tablet Take 1 tablet (600 mg total) by mouth every 6 (six) hours as needed. 01/03/23   Gerald Leitz, MD  NIFEdipine (ADALAT CC) 30 MG 24 hr tablet Take 1 tablet (30 mg total) by mouth daily. 01/05/23   Gerrit Heck, CNM  ondansetron (ZOFRAN-ODT) 8 MG disintegrating tablet Take 1 tablet (8 mg total) by mouth every 8 (eight) hours as needed for nausea or vomiting. 02/01/22   Theron Arista, PA-C  Prenatal Vit-Fe Fumarate-FA (PRENATAL MULTIVITAMIN) TABS tablet Take 1 tablet by mouth daily at 12 noon.    [provider]      Allergies    Patient has no known allergies.    Review of Systems    Review of Systems  Gastrointestinal:  Positive for abdominal pain.    Physical Exam Updated Vital Signs BP 139/70 (BP Location: Left Arm)   Pulse 65   Temp 97.7 F (36.5 C) (Oral)   Resp 18   Ht 5\' 7"  (1.702 m)   Wt 88.5 kg   SpO2 97%   BMI 30.54 kg/m  Physical Exam Vitals and nursing note reviewed.  HENT:     Head: Normocephalic and atraumatic.     Mouth/Throat:     Mouth: Mucous membranes are moist.  Eyes:     General:        Right eye: No discharge.        Left eye: No discharge.     Conjunctiva/sclera: Conjunctivae normal.  Cardiovascular:     Rate and Rhythm: Normal rate and regular rhythm.     Pulses: Normal pulses.     Heart sounds: Normal heart sounds.  Pulmonary:     Effort: Pulmonary effort is normal.     Breath sounds: Normal breath sounds.  Abdominal:     General: Abdomen is flat.     Palpations: Abdomen is soft.     Tenderness: There is abdominal tenderness in the right lower quadrant and periumbilical area. There is no guarding.  Skin:    General: Skin is warm and dry.  Neurological:     General:  No focal deficit present.  Psychiatric:        Mood and Affect: Mood normal.     ED Results / Procedures / Treatments   Labs (all labs ordered are listed, but only abnormal results are displayed) Labs Reviewed  CBC WITH DIFFERENTIAL/PLATELET - Abnormal; Notable for the following components:      Result Value   MCV 75.7 (*)    MCH 24.5 (*)    All other components within normal limits  COMPREHENSIVE METABOLIC PANEL - Abnormal; Notable for the following components:   Potassium 3.4 (*)    Glucose, Bld 102 (*)    Calcium 8.2 (*)    AST 12 (*)    All other components within normal limits  LIPASE, BLOOD  HCG, SERUM, QUALITATIVE  URINALYSIS, ROUTINE W REFLEX MICROSCOPIC  PREGNANCY, URINE    EKG None  Radiology CT ABDOMEN PELVIS W CONTRAST  Result Date: 05/10/2023 CLINICAL DATA:  Acute onset right lower quadrant pain this morning. EXAM: CT  ABDOMEN AND PELVIS WITH CONTRAST TECHNIQUE: Multidetector CT imaging of the abdomen and pelvis was performed using the standard protocol following bolus administration of intravenous contrast. RADIATION DOSE REDUCTION: This exam was performed according to the departmental dose-optimization program which includes automated exposure control, adjustment of the mA and/or kV according to patient size and/or use of iterative reconstruction technique. CONTRAST:  Pain this morning. COMPARISON:  02/01/2022 FINDINGS: Lower Chest: No acute findings. Hepatobiliary: No suspicious hepatic masses identified. Small hepatic cyst again noted. Gallbladder is unremarkable. No evidence of biliary ductal dilatation. Pancreas:  No mass or inflammatory changes. Spleen: Within normal limits in size and appearance. Adrenals/Urinary Tract: No suspicious masses identified. No evidence of ureteral calculi or hydronephrosis. Stomach/Bowel: No evidence of obstruction, inflammatory process or abnormal fluid collections. Normal appendix visualized. Vascular/Lymphatic: No pathologically enlarged lymph nodes. No acute vascular findings. Reproductive: IUD is seen in expected position. 4 cm fibroid again seen in the uterine fundus. A simple cyst measuring 3.4 cm is seen in the left adnexa, most likely physiologic in a reproductive age female. No No evidence of free fluid or inflammatory changes. Other:  None. Musculoskeletal:  No suspicious bone lesions identified. IMPRESSION: No evidence of appendicitis. 4 cm uterine fibroid. 3.4 cm benign-appearing left adnexal cyst, most likely physiologic in a reproductive age female. No follow-up imaging recommended. Note: This recommendation does not apply to premenarchal patients and to those with increased risk (genetic, family history, elevated tumor markers or other high-risk factors) of ovarian cancer. Reference: JACR 2020 Feb; 17(2):248-254 Electronically Signed   By: Danae Orleans M.D.   On: 05/10/2023  11:02    Procedures Procedures    Medications Ordered in ED Medications  ketorolac (TORADOL) 30 MG/ML injection 15 mg (has no administration in time range)  sodium chloride 0.9 % bolus 1,000 mL (1,000 mLs Intravenous New Bag/Given 05/10/23 0744)  HYDROmorphone (DILAUDID) injection 1 mg (1 mg Intravenous Given 05/10/23 0743)  ondansetron (ZOFRAN) injection 4 mg (4 mg Intravenous Given 05/10/23 0743)  iohexol (OMNIPAQUE) 300 MG/ML solution 100 mL (100 mLs Intravenous Contrast Given 05/10/23 1019)    ED Course/ Medical Decision Making/ A&P                             Medical Decision Making Amount and/or Complexity of Data Reviewed Labs: ordered. Radiology: ordered.  Risk Prescription drug management.   Initial Impression and Ddx 32 year old well-appearing female presenting for abdominal pain.  Exam notable  for right lower quadrant and periumbilical tenderness.  DDx includes appendicitis, nephrolithiasis, ovarian torsion, MSK, ectopic pregnancy, other. Patient PMH that increases complexity of ED encounter:  GERD, uterine fibroids, and anemia  Interpretation of Diagnostics - I independent reviewed and interpreted the labs as followed: mild hypokalemia  - I independently visualized the following imaging with scope of interpretation limited to determining acute life threatening conditions related to emergency care: CT abdomen/pelvis, which revealed uterine fibroid and left adnexal cyst  Patient Reassessment and Ultimate Disposition/Management Patient states she felt much better after treatment.  Considered ovarian torsion unlikely given pain is on the right side and no CT findings to suggest torsion.  Suspect MSK injury.  Advised to follow-up with OB/GYN.  Discussed pertinent return precautions especially regarding ovarian torsion.  Vital stable.  Discharged home in good condition.  Patient management required discussion with the following services or consulting groups:   None  Complexity of Problems Addressed Acute complicated illness or Injury  Additional Data Reviewed and Analyzed Further history obtained from: Past medical history and medications listed in the EMR and Prior ED visit notes  Patient Encounter Risk Assessment None         Final Clinical Impression(s) / ED Diagnoses Final diagnoses:  Abdominal pain, unspecified abdominal location    Rx / DC Orders ED Discharge Orders     None         Gareth Eagle, PA-C 05/10/23 1146    Bethann Berkshire, MD 05/12/23 1139

## 2023-05-10 NOTE — ED Triage Notes (Signed)
BIBA- right sided abd pain that woke her up from sleep around 0430. Hx of ovarian fibroids and states pain feels similar. Had one episode of nausea/vomiting. Unable to keep ibuprofen down at home. Still has appendix and gallbladder.

## 2023-05-10 NOTE — ED Notes (Signed)
Pt provided discharge instructions and prescription information. Pt was given the opportunity to ask questions and questions were answered.   

## 2023-05-10 NOTE — Discharge Instructions (Addendum)
Evaluation of your abdominal pain was reassuring.  Recommend you follow-up with OB/GYN for your ongoing uterine fibroid and cyst discovered on the left adnexal tissue near your ovary.  If you have worsening abdominal pain, abnormal vaginal discharge or bleeding, develop a fever, or any other concerning symptom please return emergency department further evaluation.

## 2023-05-12 DIAGNOSIS — N921 Excessive and frequent menstruation with irregular cycle: Secondary | ICD-10-CM | POA: Diagnosis not present

## 2023-05-12 DIAGNOSIS — N83202 Unspecified ovarian cyst, left side: Secondary | ICD-10-CM | POA: Diagnosis not present

## 2023-05-12 DIAGNOSIS — R102 Pelvic and perineal pain: Secondary | ICD-10-CM | POA: Diagnosis not present

## 2023-05-12 DIAGNOSIS — Z975 Presence of (intrauterine) contraceptive device: Secondary | ICD-10-CM | POA: Diagnosis not present

## 2023-05-13 DIAGNOSIS — M2560 Stiffness of unspecified joint, not elsewhere classified: Secondary | ICD-10-CM | POA: Diagnosis not present

## 2023-05-14 DIAGNOSIS — N83202 Unspecified ovarian cyst, left side: Secondary | ICD-10-CM | POA: Diagnosis not present

## 2023-05-27 DIAGNOSIS — H538 Other visual disturbances: Secondary | ICD-10-CM | POA: Diagnosis not present

## 2023-05-27 DIAGNOSIS — R519 Headache, unspecified: Secondary | ICD-10-CM | POA: Diagnosis not present

## 2023-05-28 DIAGNOSIS — M0609 Rheumatoid arthritis without rheumatoid factor, multiple sites: Secondary | ICD-10-CM | POA: Diagnosis not present

## 2023-05-28 DIAGNOSIS — M79671 Pain in right foot: Secondary | ICD-10-CM | POA: Diagnosis not present

## 2023-05-28 DIAGNOSIS — M2550 Pain in unspecified joint: Secondary | ICD-10-CM | POA: Diagnosis not present

## 2023-05-28 DIAGNOSIS — M25551 Pain in right hip: Secondary | ICD-10-CM | POA: Diagnosis not present

## 2023-05-28 DIAGNOSIS — M1990 Unspecified osteoarthritis, unspecified site: Secondary | ICD-10-CM | POA: Diagnosis not present

## 2023-05-28 DIAGNOSIS — M79641 Pain in right hand: Secondary | ICD-10-CM | POA: Diagnosis not present

## 2023-05-28 DIAGNOSIS — M7989 Other specified soft tissue disorders: Secondary | ICD-10-CM | POA: Diagnosis not present

## 2023-05-28 DIAGNOSIS — M25552 Pain in left hip: Secondary | ICD-10-CM | POA: Diagnosis not present

## 2023-05-28 DIAGNOSIS — M25571 Pain in right ankle and joints of right foot: Secondary | ICD-10-CM | POA: Diagnosis not present

## 2023-05-28 DIAGNOSIS — M79642 Pain in left hand: Secondary | ICD-10-CM | POA: Diagnosis not present

## 2023-05-28 DIAGNOSIS — M25561 Pain in right knee: Secondary | ICD-10-CM | POA: Diagnosis not present

## 2023-05-28 DIAGNOSIS — M79672 Pain in left foot: Secondary | ICD-10-CM | POA: Diagnosis not present

## 2023-05-28 DIAGNOSIS — M25562 Pain in left knee: Secondary | ICD-10-CM | POA: Diagnosis not present

## 2023-05-28 DIAGNOSIS — M791 Myalgia, unspecified site: Secondary | ICD-10-CM | POA: Diagnosis not present

## 2023-05-28 DIAGNOSIS — M25572 Pain in left ankle and joints of left foot: Secondary | ICD-10-CM | POA: Diagnosis not present

## 2023-05-29 DIAGNOSIS — M79661 Pain in right lower leg: Secondary | ICD-10-CM | POA: Diagnosis not present

## 2023-06-10 DIAGNOSIS — Z3041 Encounter for surveillance of contraceptive pills: Secondary | ICD-10-CM | POA: Diagnosis not present

## 2023-06-10 DIAGNOSIS — Z30432 Encounter for removal of intrauterine contraceptive device: Secondary | ICD-10-CM | POA: Diagnosis not present

## 2023-06-12 DIAGNOSIS — M722 Plantar fascial fibromatosis: Secondary | ICD-10-CM | POA: Diagnosis not present

## 2023-06-12 DIAGNOSIS — M25561 Pain in right knee: Secondary | ICD-10-CM | POA: Diagnosis not present

## 2023-06-17 IMAGING — CT CT ABD-PELV W/ CM
2 of 4 series · 16 of 46 positions shown, 18 images · IV contrast (Omnipaque)
Comparison: None.

CLINICAL DATA: Left lower quadrant pain.

EXAM:
CT ABDOMEN AND PELVIS WITH CONTRAST
TECHNIQUE: Multidetector CT imaging of the abdomen and pelvis was performed
using the standard protocol following bolus administration of
intravenous contrast.

[Series 4: axial st · axial · 0.75mm/px · z∈[-488,-54]mm · 13 of 95 slices shown, 15 images]
[im 4/95  soft-tissue]
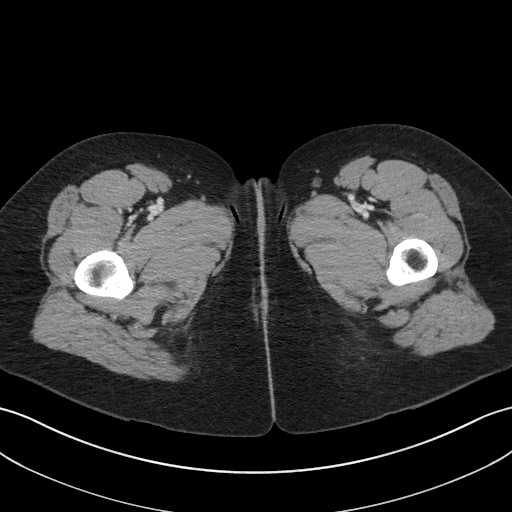
[im 4/95  bone]
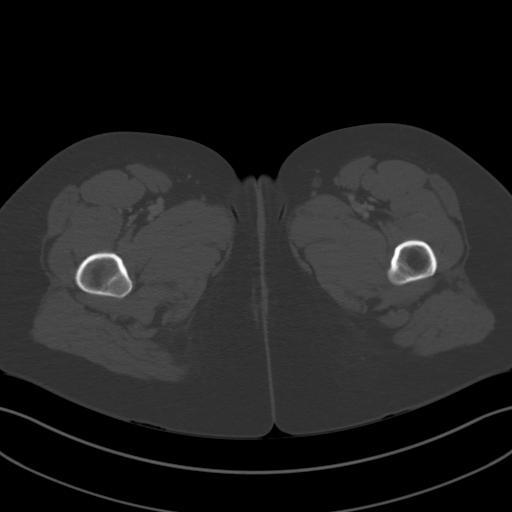
[im 12/95  soft-tissue]
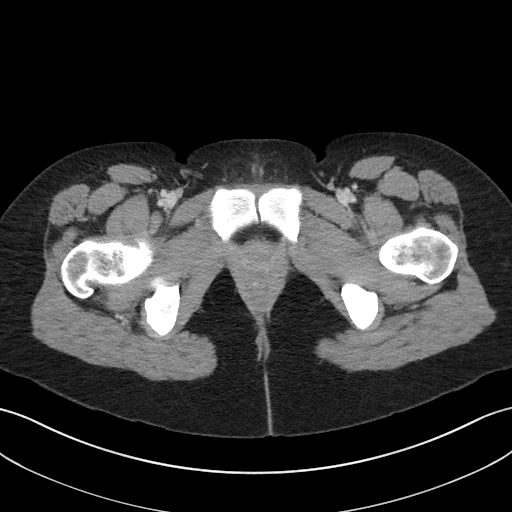
[im 20/95  soft-tissue]
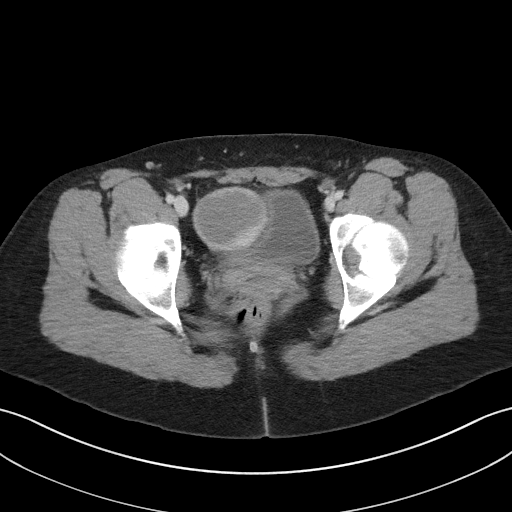
[im 28/95  soft-tissue]
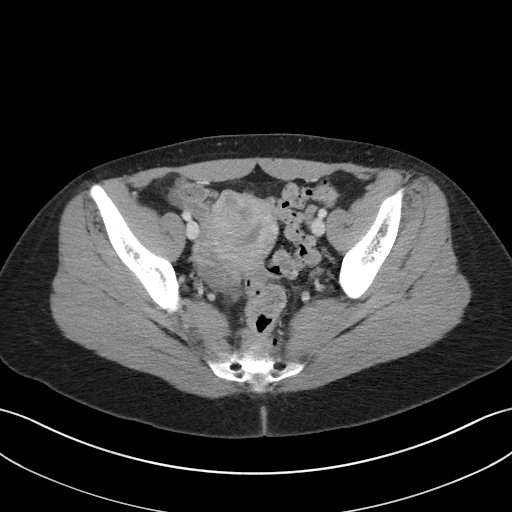
[im 32/95  soft-tissue]
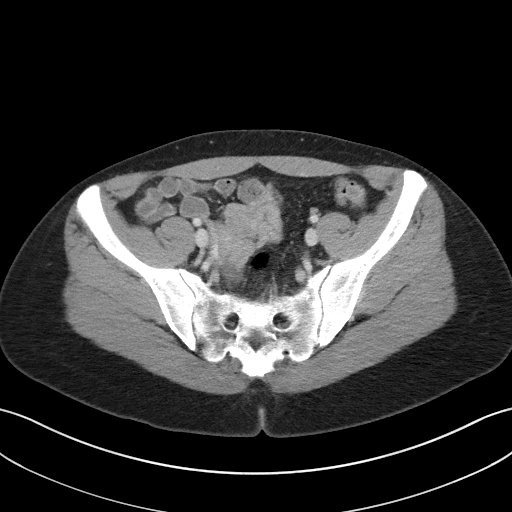
[im 40/95  soft-tissue]
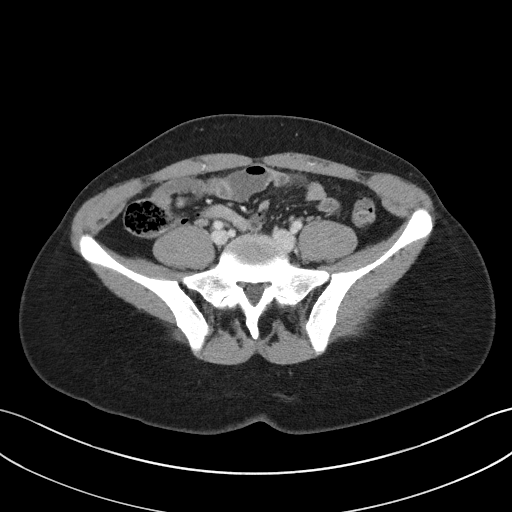
[im 48/95  soft-tissue]
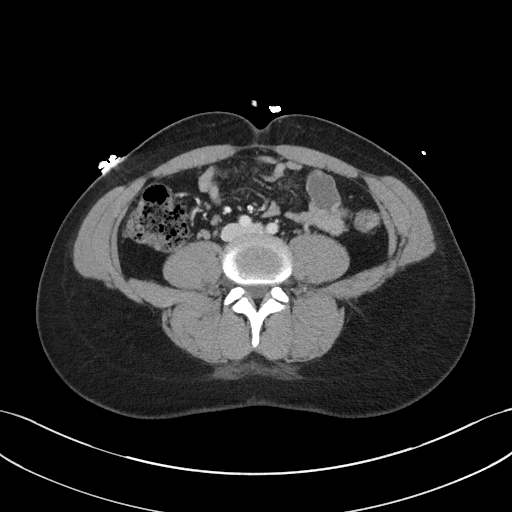
[im 55/95  soft-tissue]
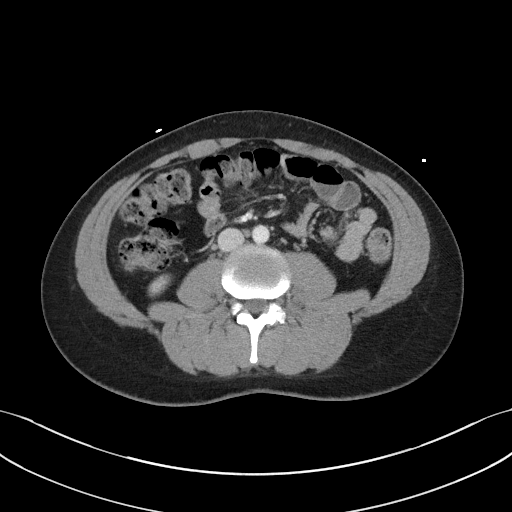
[im 63/95  soft-tissue]
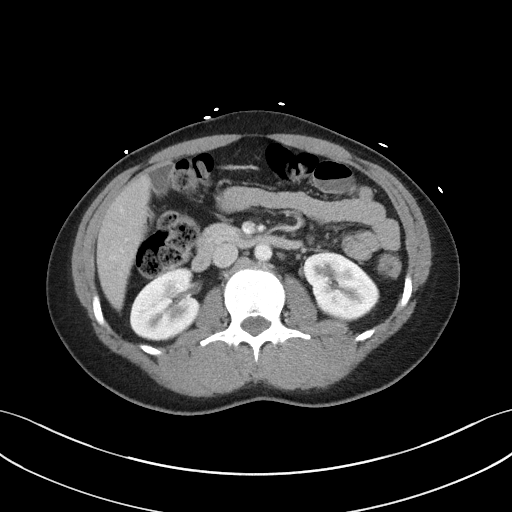
[im 63/95  bone]
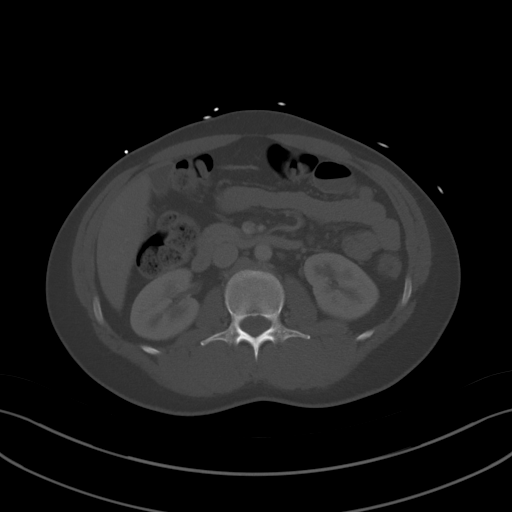
[im 67/95  soft-tissue]
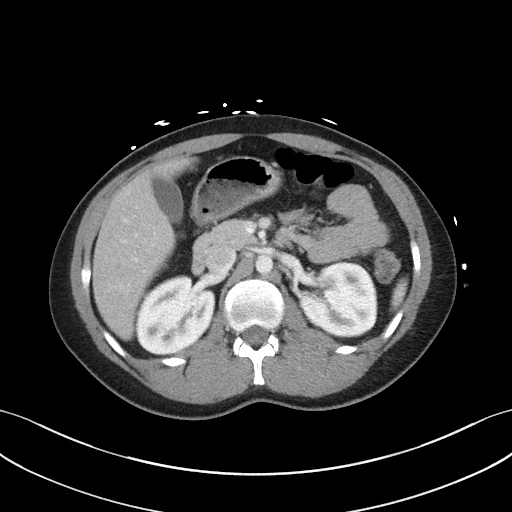
[im 75/95  soft-tissue]
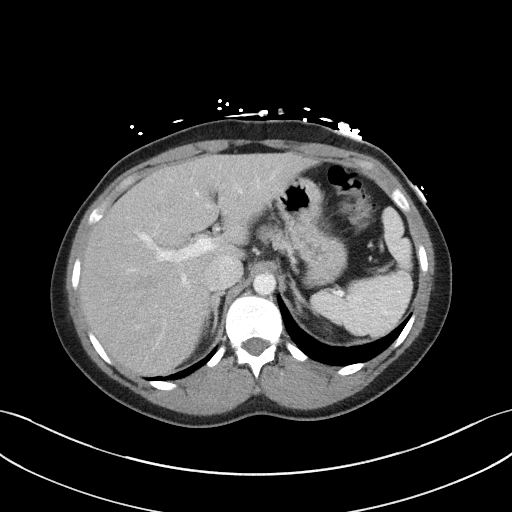
[im 83/95  soft-tissue]
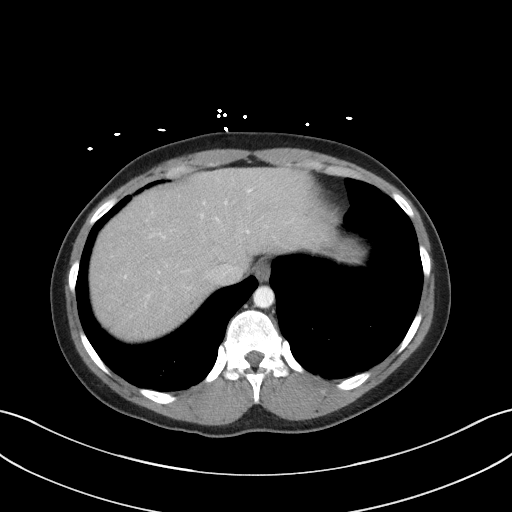
[im 91/95  soft-tissue]
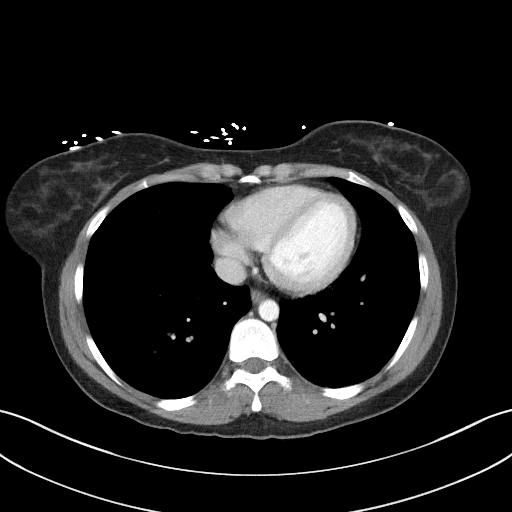

[Series 7: coronal st · coronal · 0.78mm/px · 3 of 85 slices shown]
[im 29/85  soft-tissue]
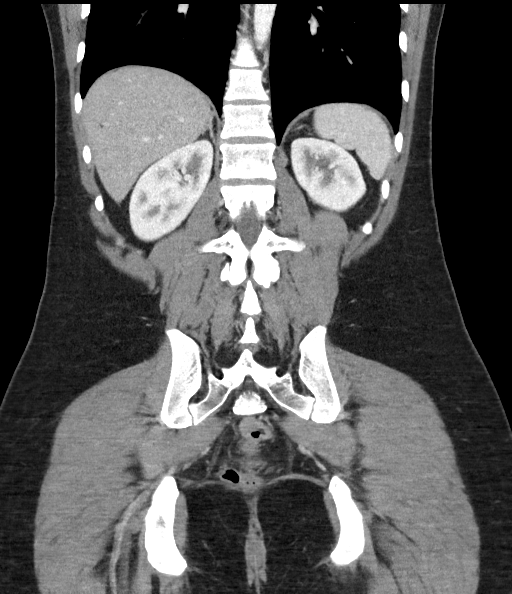
[im 38/85  soft-tissue]
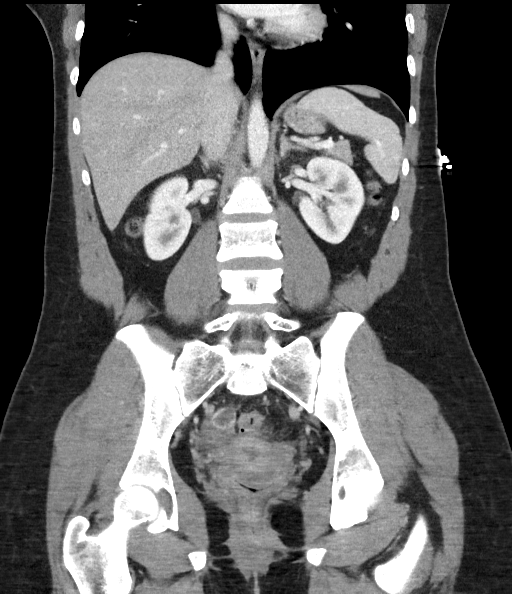
[im 47/85  soft-tissue]
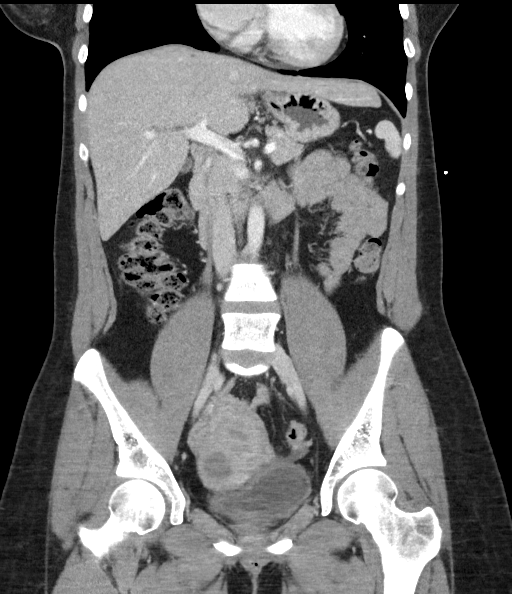

[16 of 46 positions shown; findings below may reference images not displayed]

RADIATION DOSE REDUCTION: This exam was performed according to the
departmental dose-optimization program which includes automated
exposure control, adjustment of the mA and/or kV according to
patient size and/or use of iterative reconstruction technique.

CONTRAST:  100mL OMNIPAQUE IOHEXOL 300 MG/ML  SOLN
FINDINGS: Lower chest: No acute abnormality.

Hepatobiliary: A 10 mm well-defined focus of parenchymal low
attenuation is seen within the posterior aspect of the right lobe
liver. Additional tiny, similar appearing foci are seen within the
right lobe. No gallstones, gallbladder wall thickening, or biliary
dilatation.

Pancreas: Unremarkable. No pancreatic ductal dilatation or
surrounding inflammatory changes.

Spleen: Normal in size without focal abnormality.

Adrenals/Urinary Tract: Adrenal glands are unremarkable. Kidneys are
normal, without renal calculi, focal lesion, or hydronephrosis.
Bladder is unremarkable.

Stomach/Bowel: Stomach is within normal limits. Appendix appears
normal. No evidence of bowel wall thickening, distention, or
inflammatory changes.

Vascular/Lymphatic: No significant vascular findings are present. No
enlarged abdominal or pelvic lymph nodes.

Reproductive: Adjacent 3.9 cm x 3.2 cm and 2.6 cm x 2.0 cm areas of
parenchymal low attenuation are seen within the posterior aspect of
the uterine fundus (approximately 37.44 Hounsfield units).

A 1.7 cm x 1.1 cm cyst is noted within the posterior aspect of the
right adnexa.

Other: No abdominal wall hernia or abnormality. No abdominopelvic
ascites.

Musculoskeletal: No acute or significant osseous findings.
IMPRESSION: 1. Findings likely consistent with uterine fibroids. Further
evaluation with nonemergent pelvic ultrasound is recommended.
2. Small right adnexal cyst, likely ovarian in origin. No follow-up
imaging is recommended. Reference: JACR [DATE]):248-254
3. Small hepatic cysts versus hemangiomas.

## 2023-08-08 DIAGNOSIS — N390 Urinary tract infection, site not specified: Secondary | ICD-10-CM | POA: Diagnosis not present

## 2023-09-29 ENCOUNTER — Other Ambulatory Visit (HOSPITAL_COMMUNITY): Payer: Self-pay | Admitting: Family Medicine

## 2023-09-29 DIAGNOSIS — R200 Anesthesia of skin: Secondary | ICD-10-CM

## 2023-09-30 ENCOUNTER — Ambulatory Visit (HOSPITAL_COMMUNITY)
Admission: RE | Admit: 2023-09-30 | Discharge: 2023-09-30 | Disposition: A | Discharge status: Home / Self Care | Payer: BC Managed Care – PPO | Source: Ambulatory Visit | Attending: Family Medicine | Admitting: Family Medicine

## 2023-09-30 DIAGNOSIS — M47817 Spondylosis without myelopathy or radiculopathy, lumbosacral region: Secondary | ICD-10-CM | POA: Diagnosis not present

## 2023-09-30 DIAGNOSIS — R200 Anesthesia of skin: Secondary | ICD-10-CM | POA: Insufficient documentation

## 2023-09-30 DIAGNOSIS — M47816 Spondylosis without myelopathy or radiculopathy, lumbar region: Secondary | ICD-10-CM | POA: Diagnosis not present

## 2023-09-30 DIAGNOSIS — M48061 Spinal stenosis, lumbar region without neurogenic claudication: Secondary | ICD-10-CM | POA: Diagnosis not present

## 2023-09-30 DIAGNOSIS — M5416 Radiculopathy, lumbar region: Secondary | ICD-10-CM | POA: Diagnosis not present

## 2023-09-30 DIAGNOSIS — R296 Repeated falls: Secondary | ICD-10-CM | POA: Diagnosis not present

## 2023-09-30 DIAGNOSIS — M545 Low back pain, unspecified: Secondary | ICD-10-CM | POA: Diagnosis not present

## 2023-09-30 MED ORDER — GADOBUTROL 1 MMOL/ML IV SOLN
9.0000 mL | Freq: Once | INTRAVENOUS | Status: AC | PRN
  Administered 2023-09-30: 9 mL via INTRAVENOUS

## 2023-10-14 DIAGNOSIS — M5416 Radiculopathy, lumbar region: Secondary | ICD-10-CM | POA: Diagnosis not present

## 2023-10-21 DIAGNOSIS — M531 Cervicobrachial syndrome: Secondary | ICD-10-CM | POA: Diagnosis not present

## 2023-10-21 DIAGNOSIS — M5417 Radiculopathy, lumbosacral region: Secondary | ICD-10-CM | POA: Diagnosis not present

## 2023-10-21 DIAGNOSIS — M9903 Segmental and somatic dysfunction of lumbar region: Secondary | ICD-10-CM | POA: Diagnosis not present

## 2023-10-21 DIAGNOSIS — M9901 Segmental and somatic dysfunction of cervical region: Secondary | ICD-10-CM | POA: Diagnosis not present

## 2023-10-31 DIAGNOSIS — M531 Cervicobrachial syndrome: Secondary | ICD-10-CM | POA: Diagnosis not present

## 2023-10-31 DIAGNOSIS — M9901 Segmental and somatic dysfunction of cervical region: Secondary | ICD-10-CM | POA: Diagnosis not present

## 2023-10-31 DIAGNOSIS — M5417 Radiculopathy, lumbosacral region: Secondary | ICD-10-CM | POA: Diagnosis not present

## 2023-10-31 DIAGNOSIS — M9903 Segmental and somatic dysfunction of lumbar region: Secondary | ICD-10-CM | POA: Diagnosis not present

## 2023-11-04 DIAGNOSIS — M9903 Segmental and somatic dysfunction of lumbar region: Secondary | ICD-10-CM | POA: Diagnosis not present

## 2023-11-04 DIAGNOSIS — M9901 Segmental and somatic dysfunction of cervical region: Secondary | ICD-10-CM | POA: Diagnosis not present

## 2023-11-04 DIAGNOSIS — M5417 Radiculopathy, lumbosacral region: Secondary | ICD-10-CM | POA: Diagnosis not present

## 2023-11-04 DIAGNOSIS — M531 Cervicobrachial syndrome: Secondary | ICD-10-CM | POA: Diagnosis not present

## 2023-11-06 DIAGNOSIS — Z6834 Body mass index (BMI) 34.0-34.9, adult: Secondary | ICD-10-CM | POA: Diagnosis not present

## 2023-11-06 DIAGNOSIS — R200 Anesthesia of skin: Secondary | ICD-10-CM | POA: Diagnosis not present

## 2023-11-07 DIAGNOSIS — M531 Cervicobrachial syndrome: Secondary | ICD-10-CM | POA: Diagnosis not present

## 2023-11-07 DIAGNOSIS — M9901 Segmental and somatic dysfunction of cervical region: Secondary | ICD-10-CM | POA: Diagnosis not present

## 2023-11-07 DIAGNOSIS — M5417 Radiculopathy, lumbosacral region: Secondary | ICD-10-CM | POA: Diagnosis not present

## 2023-11-07 DIAGNOSIS — M9903 Segmental and somatic dysfunction of lumbar region: Secondary | ICD-10-CM | POA: Diagnosis not present

## 2024-02-09 DIAGNOSIS — N939 Abnormal uterine and vaginal bleeding, unspecified: Secondary | ICD-10-CM | POA: Diagnosis not present

## 2024-02-11 DIAGNOSIS — N939 Abnormal uterine and vaginal bleeding, unspecified: Secondary | ICD-10-CM | POA: Diagnosis not present

## 2024-03-09 DIAGNOSIS — F3281 Premenstrual dysphoric disorder: Secondary | ICD-10-CM | POA: Diagnosis not present

## 2024-03-09 DIAGNOSIS — L659 Nonscarring hair loss, unspecified: Secondary | ICD-10-CM | POA: Diagnosis not present

## 2024-03-09 DIAGNOSIS — G43909 Migraine, unspecified, not intractable, without status migrainosus: Secondary | ICD-10-CM | POA: Diagnosis not present

## 2024-06-16 DIAGNOSIS — N898 Other specified noninflammatory disorders of vagina: Secondary | ICD-10-CM | POA: Diagnosis not present

## 2024-06-16 DIAGNOSIS — Z124 Encounter for screening for malignant neoplasm of cervix: Secondary | ICD-10-CM | POA: Diagnosis not present

## 2024-06-16 DIAGNOSIS — N943 Premenstrual tension syndrome: Secondary | ICD-10-CM | POA: Diagnosis not present

## 2024-06-16 DIAGNOSIS — Z01419 Encounter for gynecological examination (general) (routine) without abnormal findings: Secondary | ICD-10-CM | POA: Diagnosis not present

## 2024-06-16 DIAGNOSIS — Z113 Encounter for screening for infections with a predominantly sexual mode of transmission: Secondary | ICD-10-CM | POA: Diagnosis not present

## 2024-06-16 DIAGNOSIS — Z1151 Encounter for screening for human papillomavirus (HPV): Secondary | ICD-10-CM | POA: Diagnosis not present

## 2024-09-17 DIAGNOSIS — N943 Premenstrual tension syndrome: Secondary | ICD-10-CM | POA: Diagnosis not present

## 2024-09-20 ENCOUNTER — Other Ambulatory Visit: Payer: Self-pay

## 2024-09-20 ENCOUNTER — Emergency Department (HOSPITAL_BASED_OUTPATIENT_CLINIC_OR_DEPARTMENT_OTHER)

## 2024-09-20 ENCOUNTER — Encounter (HOSPITAL_BASED_OUTPATIENT_CLINIC_OR_DEPARTMENT_OTHER): Payer: Self-pay | Admitting: Emergency Medicine

## 2024-09-20 ENCOUNTER — Emergency Department (HOSPITAL_BASED_OUTPATIENT_CLINIC_OR_DEPARTMENT_OTHER)
Admission: EM | Admit: 2024-09-20 | Discharge: 2024-09-20 | Disposition: A | Discharge status: Home / Self Care | Attending: Emergency Medicine | Admitting: Emergency Medicine

## 2024-09-20 ENCOUNTER — Other Ambulatory Visit: Payer: Self-pay | Admitting: Nurse Practitioner

## 2024-09-20 DIAGNOSIS — Z8742 Personal history of other diseases of the female genital tract: Secondary | ICD-10-CM

## 2024-09-20 DIAGNOSIS — I10 Essential (primary) hypertension: Secondary | ICD-10-CM | POA: Insufficient documentation

## 2024-09-20 DIAGNOSIS — R102 Pelvic and perineal pain unspecified side: Secondary | ICD-10-CM

## 2024-09-20 DIAGNOSIS — R0789 Other chest pain: Secondary | ICD-10-CM | POA: Insufficient documentation

## 2024-09-20 DIAGNOSIS — N946 Dysmenorrhea, unspecified: Secondary | ICD-10-CM

## 2024-09-20 DIAGNOSIS — R079 Chest pain, unspecified: Secondary | ICD-10-CM | POA: Diagnosis not present

## 2024-09-20 DIAGNOSIS — R030 Elevated blood-pressure reading, without diagnosis of hypertension: Secondary | ICD-10-CM

## 2024-09-20 LAB — BASIC METABOLIC PANEL WITH GFR
Anion gap: 13 (ref 5–15)
BUN: 8 mg/dL (ref 6–20)
CO2: 22 mmol/L (ref 22–32)
Calcium: 8.9 mg/dL (ref 8.9–10.3)
Chloride: 104 mmol/L (ref 98–111)
Creatinine, Ser: 0.86 mg/dL (ref 0.44–1.00)
GFR, Estimated: >60 mL/min (ref >60)
Glucose, Bld: 99 mg/dL (ref 70–99)
Potassium: 3.6 mmol/L (ref 3.5–5.1)
Sodium: 139 mmol/L (ref 135–145)

## 2024-09-20 LAB — TROPONIN T, HIGH SENSITIVITY
Troponin T High Sensitivity: <15 ng/L (ref 0–19)
Troponin T High Sensitivity: <15 ng/L (ref 0–19)

## 2024-09-20 LAB — CBC
HCT: 40.4 % (ref 36.0–46.0)
Hemoglobin: 13.5 g/dL (ref 12.0–15.0)
MCH: 24.3 pg — ABNORMAL LOW (ref 26.0–34.0)
MCHC: 33.4 g/dL (ref 30.0–36.0)
MCV: 72.8 fL — ABNORMAL LOW (ref 80.0–100.0)
Platelets: 368 K/uL (ref 150–400)
RBC: 5.55 MIL/uL — ABNORMAL HIGH (ref 3.87–5.11)
RDW: 15.4 % (ref 11.5–15.5)
WBC: 7.2 K/uL (ref 4.0–10.5)
nRBC: 0 % (ref 0.0–0.2)

## 2024-09-20 LAB — D-DIMER, QUANTITATIVE: D-Dimer, Quant: 0.39 ug{FEU}/mL (ref 0.00–0.50)

## 2024-09-20 NOTE — ED Provider Notes (Signed)
  EMERGENCY DEPARTMENT AT [facility] Provider Note   CSN: [id number] Arrival date & time: 09/20/24  1250     Patient presents with: Chest Pain   Colleen Page is a 33 y.o. female who presents emergency department with chief complaint of chest discomfort.  Patient reports that over the past few days she has had intermittent sharp, fleeting retrosternal chest pain followed by a sense of pressure.  She states that she thought it might be due to anxiety but seems to keep coming back.  She occasionally feels difficulty taking a deep breath but denies any exertional chest pain or shortness of breath.  She denies syncope difficulty lying flat.  She is on oral contraceptive pills and does have an issue with chronic right lower extremity swelling.  She has also just been feeling foggy and off.  She reports that she has a lot of life stress but no more than normal and does not think that she is having significant anxiety.    Chest Pain      Prior to Admission medications   Medication Sig Start Date End Date Taking? Authorizing Provider  calcium carbonate (TUMS - DOSED IN MG ELEMENTAL CALCIUM) 500 MG chewable tablet Chew 1 tablet by mouth daily.    [provider]  furosemide  (LASIX ) 20 MG tablet Take 1 tablet (20 mg total) by mouth daily. 01/05/23   Synthia Raisin, CNM  ibuprofen  (ADVIL ) 600 MG tablet Take 1 tablet (600 mg total) by mouth every 6 (six) hours as needed. 01/03/23   Rosalva Sawyer, MD  NIFEdipine  (ADALAT  CC) 30 MG 24 hr tablet Take 1 tablet (30 mg total) by mouth daily. 01/05/23   Synthia Raisin, CNM  ondansetron  (ZOFRAN -ODT) 8 MG disintegrating tablet Take 1 tablet (8 mg total) by mouth every 8 (eight) hours as needed for nausea or vomiting. 02/01/22   Emelia Sluder, PA-C  Prenatal Vit-Fe Fumarate-FA (PRENATAL MULTIVITAMIN) TABS tablet Take 1 tablet by mouth daily at 12 noon.    [provider]    Allergies: Patient has no known allergies.    Review  of Systems  Cardiovascular:  Positive for chest pain.    Updated Vital Signs BP (!) 125/93   Pulse 76   Temp 99 F (37.2 C) (Oral)   Resp 14   Ht 5' 7 (1.702 m)   Wt 89.4 kg   LMP 09/12/2024 (Exact Date)   SpO2 99%   Breastfeeding No   BMI 30.85 kg/m   Physical Exam Vitals and nursing note reviewed.  Constitutional:      General: She is not in acute distress.    Appearance: She is well-developed. She is not diaphoretic.  HENT:     Head: Normocephalic and atraumatic.     Right Ear: External ear normal.     Left Ear: External ear normal.     Nose: Nose normal.     Mouth/Throat:     Mouth: Mucous membranes are moist.  Eyes:     General: No scleral icterus.    Conjunctiva/sclera: Conjunctivae normal.  Cardiovascular:     Rate and Rhythm: Normal rate and regular rhythm.     Heart sounds: Normal heart sounds. No murmur heard.    No friction rub. No gallop.  Pulmonary:     Effort: Pulmonary effort is normal. No respiratory distress.     Breath sounds: Normal breath sounds. No wheezing or rhonchi.  Abdominal:     General: Bowel sounds are normal. There is no  distension.     Palpations: Abdomen is soft. There is no mass.     Tenderness: There is no abdominal tenderness. There is no guarding.  Musculoskeletal:     Cervical back: Normal range of motion.     Right lower leg: No tenderness.     Left lower leg: No tenderness.  Skin:    General: Skin is warm and dry.  Neurological:     Mental Status: She is alert and oriented to person, place, and time.  Psychiatric:        Behavior: Behavior normal.     (all labs ordered are listed, but only abnormal results are displayed) Labs Reviewed  CBC - Abnormal; Notable for the following components:      Result Value   RBC 5.55 (*)    MCV 72.8 (*)    MCH 24.3 (*)    All other components within normal limits  BASIC METABOLIC PANEL WITH GFR  D-DIMER, QUANTITATIVE  PREGNANCY, URINE  TROPONIN T, HIGH SENSITIVITY  TROPONIN  T, HIGH SENSITIVITY    EKG: EKG Interpretation Date/Time:  Monday September 20 2024 12:58:09 EST Ventricular Rate:  86 PR Interval:  163 QRS Duration:  82 QT Interval:  380 QTC Calculation: 455 R Axis:   79  Text Interpretation: Sinus rhythm Baseline wander Confirmed by Bernard Drivers ([id number]) on 09/20/2024 1:02:16 PM  Radiology: DG Chest 2 View Result Date: 09/20/2024 CLINICAL DATA:  Chest pain.  Hypertension for 3 days. EXAM: DG CHEST 2V COMPARISON:  09/14/2011 FINDINGS: Cardiomediastinal silhouette and pulmonary vasculature are within normal limits. Lungs are clear. IMPRESSION: No acute cardiopulmonary process. Electronically Signed   By: Aliene Lloyd M.D.   On: 09/20/2024 13:36     Procedures   Medications Ordered in the ED - No data to display              HEART Score: 0                Geneva (Revised) Score: 3, Geneva Score Interpretation: Low Risk Group: 7-9% incidence of pulmonary embolism from several studies PERC Score: 1, PERC Score Interpretation: If any criteria are positive, the PERC rule cannot be used to rule out PE in this patient Medical Decision Making Given the large differential diagnosis for [facility], the decision making in this case is of high complexity.  After evaluating all of the data points in this case, the presentation of Colleen Page is NOT consistent with Acute Coronary Syndrome (ACS) and/or myocardial ischemia, pulmonary embolism, aortic dissection; Borhaave's, significant arrythmia, pneumothorax, cardiac tamponade, or other emergent cardiopulmonary condition.  Further, the presentation of Colleen Page is NOT consistent with pericarditis, myocarditis, cholecystitis, pancreatitis, mediastinitis, endocarditis, new valvular disease.  Additionally, the presentation of Colleen Page NOT consistent with flail chest, cardiac contusion, ARDS, or significant intra-thoracic or intra-abdominal bleeding.  Moreover, this presentation is NOT consistent  with pneumonia, sepsis, or pyelonephritis.  The patient has a HEART Score: 0    Strict return and follow-up precautions have been given by me personally or by detailed written instruction given verbally by nursing staff using the teach back method to the patient/family/caregiver(s).  Data Reviewed/Counseling: I have reviewed the patient's vital signs, nursing notes, and other relevant tests/information. I had a detailed discussion regarding the historical points, exam findings, and any diagnostic results supporting the discharge diagnosis. I also discussed the need for outpatient follow-up and the need to return to the ED if symptoms worsen or if there are any questions or  concerns that arise at home.    Amount and/or Complexity of Data Reviewed Labs: ordered.    Details: Troponin neg x2, normal d-dimer Radiology: ordered and independent interpretation performed.    Details: I personally visualized and interpreted the images using our PACS system. Acute findings include:  No acute findings  ECG/medicine tests: ordered and independent interpretation performed.    Details: NSR rate of 86 no ischemic changes             Final diagnoses:  Chest discomfort  Elevated blood pressure reading    ED Discharge Orders     None          Arloa Chroman, PA-C 09/20/24 2148    Tegeler, Lonni PARAS, MD 09/20/24 2328

## 2024-09-20 NOTE — ED Notes (Signed)
 Discharge instructions reviewed with patient. Patient verbalizes understanding, no further questions at this time. Medications and follow up information provided. No acute distress noted at time of departure.

## 2024-09-20 NOTE — ED Triage Notes (Signed)
 Pt c/o elevated bp x 3 days, feel off, and L chest pain since this AM.

## 2024-09-20 NOTE — Discharge Instructions (Signed)

## 2024-09-23 ENCOUNTER — Other Ambulatory Visit

## 2024-10-24 ENCOUNTER — Emergency Department (HOSPITAL_BASED_OUTPATIENT_CLINIC_OR_DEPARTMENT_OTHER): Admission: EM | Admit: 2024-10-24 | Discharge: 2024-10-24 | Disposition: A | Discharge status: Home / Self Care

## 2024-10-24 ENCOUNTER — Encounter (HOSPITAL_BASED_OUTPATIENT_CLINIC_OR_DEPARTMENT_OTHER): Payer: Self-pay

## 2024-10-24 ENCOUNTER — Other Ambulatory Visit: Payer: Self-pay

## 2024-10-24 ENCOUNTER — Emergency Department (HOSPITAL_BASED_OUTPATIENT_CLINIC_OR_DEPARTMENT_OTHER)

## 2024-10-24 DIAGNOSIS — R350 Frequency of micturition: Secondary | ICD-10-CM | POA: Insufficient documentation

## 2024-10-24 DIAGNOSIS — R10A1 Flank pain, right side: Secondary | ICD-10-CM | POA: Diagnosis present

## 2024-10-24 DIAGNOSIS — R110 Nausea: Secondary | ICD-10-CM | POA: Diagnosis not present

## 2024-10-24 LAB — COMPREHENSIVE METABOLIC PANEL WITH GFR
ALT: 14 U/L (ref 0–44)
AST: 11 U/L — ABNORMAL LOW (ref 15–41)
Albumin: 4.7 g/dL (ref 3.5–5.0)
Alkaline Phosphatase: 72 U/L (ref 38–126)
Anion gap: 12 (ref 5–15)
BUN: 10 mg/dL (ref 6–20)
CO2: 24 mmol/L (ref 22–32)
Calcium: 9.4 mg/dL (ref 8.9–10.3)
Chloride: 106 mmol/L (ref 98–111)
Creatinine, Ser: 0.87 mg/dL (ref 0.44–1.00)
GFR, Estimated: >60 mL/min
Glucose, Bld: 90 mg/dL (ref 70–99)
Potassium: 4 mmol/L (ref 3.5–5.1)
Sodium: 141 mmol/L (ref 135–145)
Total Bilirubin: 0.4 mg/dL (ref 0.0–1.2)
Total Protein: 8.1 g/dL (ref 6.5–8.1)

## 2024-10-24 LAB — PREGNANCY, URINE: Preg Test, Ur: NEGATIVE

## 2024-10-24 LAB — CBC WITH DIFFERENTIAL/PLATELET
Abs Immature Granulocytes: 0.01 K/uL (ref 0.00–0.07)
Basophils Absolute: 0.1 K/uL (ref 0.0–0.1)
Basophils Relative: 1 %
Eosinophils Absolute: 0 K/uL (ref 0.0–0.5)
Eosinophils Relative: 1 %
HCT: 41 % (ref 36.0–46.0)
Hemoglobin: 13.6 g/dL (ref 12.0–15.0)
Immature Granulocytes: 0 %
Lymphocytes Relative: 37 %
Lymphs Abs: 2.3 K/uL (ref 0.7–4.0)
MCH: 24.5 pg — ABNORMAL LOW (ref 26.0–34.0)
MCHC: 33.2 g/dL (ref 30.0–36.0)
MCV: 74 fL — ABNORMAL LOW (ref 80.0–100.0)
Monocytes Absolute: 0.3 K/uL (ref 0.1–1.0)
Monocytes Relative: 5 %
Neutro Abs: 3.5 K/uL (ref 1.7–7.7)
Neutrophils Relative %: 56 %
Platelets: 307 K/uL (ref 150–400)
RBC: 5.54 MIL/uL — ABNORMAL HIGH (ref 3.87–5.11)
RDW: 15.4 % (ref 11.5–15.5)
WBC: 6.1 K/uL (ref 4.0–10.5)
nRBC: 0 % (ref 0.0–0.2)

## 2024-10-24 LAB — URINALYSIS, ROUTINE W REFLEX MICROSCOPIC
Bilirubin Urine: NEGATIVE
Glucose, UA: NEGATIVE mg/dL
Hgb urine dipstick: NEGATIVE
Ketones, ur: NEGATIVE mg/dL
Leukocytes,Ua: NEGATIVE
Nitrite: NEGATIVE
Protein, ur: NEGATIVE mg/dL
Specific Gravity, Urine: 1.02 (ref 1.005–1.030)
pH: 6.5 (ref 5.0–8.0)

## 2024-10-24 MED ORDER — NAPROXEN 500 MG PO TABS: 500.0000 mg | ORAL_TABLET | Freq: Two times a day (BID) | ORAL | 0 refills | Status: AC

## 2024-10-24 MED ORDER — IOHEXOL 300 MG/ML  SOLN
100.0000 mL | Freq: Once | INTRAMUSCULAR | Status: AC | PRN
  Administered 2024-10-24: 80 mL via INTRAVENOUS

## 2024-10-24 NOTE — Discharge Instructions (Addendum)
 Please read and follow all provided instructions.  Your diagnoses today include:  1. Right flank pain     Tests performed today include: Complete blood cell count: Was normal Complete metabolic panel: Was normal Urinalysis (urine test): No sign of UTI Pregnancy test (urine or blood, in women only): Negative CT scan of the abdomen pelvis shows some nonspecific inflammation in the right lower abdomen, also seen in July 2024, but a bit worse today.  You also have uterine fibroids which have been noted in the past as well. Vital signs. See below for your results today.   Medications prescribed:  Naproxen  - anti-inflammatory pain medication Do not exceed 500mg  naproxen  every 12 hours, take with food  You have been prescribed an anti-inflammatory medication or NSAID. Take with food. Take smallest effective dose for the shortest duration needed for your pain. Stop taking if you experience stomach pain or vomiting.   Take any prescribed medications only as directed.  Home care instructions:  Follow any educational materials contained in this packet.  Follow-up instructions: Please follow-up with your primary care provider or OB/GYN in the next 7 days for recheck.  If not improving, you may need a an outpatient ultrasound.  Return instructions:  SEEK IMMEDIATE MEDICAL ATTENTION IF: The pain does not go away or becomes severe  A temperature above 101F develops  Repeated vomiting occurs (multiple episodes)  The pain becomes localized to portions of the abdomen. The right side could possibly be appendicitis. In an adult, the left lower portion of the abdomen could be colitis or diverticulitis.  Blood is being passed in stools or vomit (bright red or black tarry stools)  You develop chest pain, difficulty breathing, dizziness or fainting, or become confused, poorly responsive, or inconsolable (young children) If you have any other emergent concerns regarding your health  Additional  Information: Abdominal (belly) pain can be caused by many things. Your caregiver performed an examination and possibly ordered blood/urine tests and imaging (CT scan, x-rays, ultrasound). Many cases can be observed and treated at home after initial evaluation in the emergency department. Even though you are being discharged home, abdominal pain can be unpredictable. Therefore, you need a repeated exam if your pain does not resolve, returns, or worsens. Most patients with abdominal pain don't have to be admitted to the hospital or have surgery, but serious problems like appendicitis and gallbladder attacks can start out as nonspecific pain. Many abdominal conditions cannot be diagnosed in one visit, so follow-up evaluations are very important.  Your vital signs today were: BP (!) 145/104 (BP Location: Right Arm)   Pulse 73   Temp 97.6 F (36.4 C) (Oral)   Resp 16   LMP 10/11/2024 (Exact Date)   SpO2 100%  If your blood pressure (bp) was elevated above 135/85 this visit, please have this repeated by your doctor within one month. --------------

## 2024-10-24 NOTE — ED Provider Notes (Signed)
 " [facility] Provider Note   CSN: [id number] Arrival date & time: 10/24/24  9066     Patient presents with: Flank Pain   Colleen Page is a 34 y.o. female.   Patient presents to the emergency department for evaluation of right-sided flank pain.  This started about 3 to 4 days ago.  It is in the right lower back and flank area.  She was tolerating this until last night when she had more pain in the right lower abdomen and bladder area.  She reports increased urinary frequency.  She has had some associated nausea but no vomiting.  She reports constipation since onset of symptoms.  No blood in the stool.  She denies hematuria or dysuria.  She does report history of UTI.  No history of abdominal surgeries.  No fevers reported.       Prior to Admission medications  Medication Sig Start Date End Date Taking? Authorizing Provider  calcium carbonate (TUMS - DOSED IN MG ELEMENTAL CALCIUM) 500 MG chewable tablet Chew 1 tablet by mouth daily.    [provider]  furosemide  (LASIX ) 20 MG tablet Take 1 tablet (20 mg total) by mouth daily. 01/05/23   Synthia Raisin, CNM  ibuprofen  (ADVIL ) 600 MG tablet Take 1 tablet (600 mg total) by mouth every 6 (six) hours as needed. 01/03/23   Rosalva Sawyer, MD  NIFEdipine  (ADALAT  CC) 30 MG 24 hr tablet Take 1 tablet (30 mg total) by mouth daily. 01/05/23   Synthia Raisin, CNM  ondansetron  (ZOFRAN -ODT) 8 MG disintegrating tablet Take 1 tablet (8 mg total) by mouth every 8 (eight) hours as needed for nausea or vomiting. 02/01/22   Emelia Sluder, PA-C  Prenatal Vit-Fe Fumarate-FA (PRENATAL MULTIVITAMIN) TABS tablet Take 1 tablet by mouth daily at 12 noon.    [provider]    Allergies: Patient has no known allergies.    Review of Systems  Updated Vital Signs BP (!) 145/104 (BP Location: Right Arm)   Pulse 73   Temp 97.6 F (36.4 C) (Oral)   Resp 16   LMP 10/11/2024 (Exact Date)   SpO2 100%    Physical Exam Vitals and nursing note reviewed.  Constitutional:      General: She is not in acute distress.    Appearance: She is well-developed.  HENT:     Head: Normocephalic and atraumatic.     Right Ear: External ear normal.     Left Ear: External ear normal.     Nose: Nose normal.  Eyes:     Conjunctiva/sclera: Conjunctivae normal.  Cardiovascular:     Rate and Rhythm: Normal rate and regular rhythm.     Heart sounds: No murmur heard. Pulmonary:     Effort: No respiratory distress.     Breath sounds: No wheezing, rhonchi or rales.  Abdominal:     Palpations: Abdomen is soft.     Tenderness: There is no abdominal tenderness. There is no right CVA tenderness, left CVA tenderness, guarding or rebound.  Musculoskeletal:     Cervical back: Normal range of motion and neck supple.       Back:     Right lower leg: No edema.     Left lower leg: No edema.  Skin:    General: Skin is warm and dry.     Findings: No rash.  Neurological:     General: No focal deficit present.     Mental Status: She is alert. Mental  status is at baseline.     Motor: No weakness.  Psychiatric:        Mood and Affect: Mood normal.     (all labs ordered are listed, but only abnormal results are displayed) Labs Reviewed  CBC WITH DIFFERENTIAL/PLATELET - Abnormal; Notable for the following components:      Result Value   RBC 5.54 (*)    MCV 74.0 (*)    MCH 24.5 (*)    All other components within normal limits  COMPREHENSIVE METABOLIC PANEL WITH GFR - Abnormal; Notable for the following components:   AST 11 (*)    All other components within normal limits  URINALYSIS, ROUTINE W REFLEX MICROSCOPIC  PREGNANCY, URINE    EKG: None  Radiology: CT ABDOMEN PELVIS W CONTRAST Result Date: 10/24/2024 CLINICAL DATA:  Right flank pain. EXAM: CT ABDOMEN AND PELVIS WITH CONTRAST TECHNIQUE: Multidetector CT imaging of the abdomen and pelvis was performed using the standard protocol following bolus  administration of intravenous contrast. RADIATION DOSE REDUCTION: This exam was performed according to the departmental dose-optimization program which includes automated exposure control, adjustment of the mA and/or kV according to patient size and/or use of iterative reconstruction technique. CONTRAST:  80mL OMNIPAQUE  IOHEXOL  300 MG/ML  SOLN COMPARISON:  May 10, 2023 FINDINGS: Lower chest: No acute abnormality. Hepatobiliary: Small stable hepatic cysts are seen within the right lobe of the liver. No gallstones, gallbladder wall thickening, or biliary dilatation. Pancreas: Unremarkable. No pancreatic ductal dilatation or surrounding inflammatory changes. Spleen: Normal in size without focal abnormality. Adrenals/Urinary Tract: Adrenal glands are unremarkable. Kidneys are normal, without renal calculi or hydronephrosis. A stable subcentimeter simple cyst is seen within the lower pole of the left kidney. Bladder is unremarkable. Stomach/Bowel: Stomach is within normal limits. Appendix appears normal. No evidence of bowel wall thickening, distention, or inflammatory changes. Vascular/Lymphatic: No significant vascular findings are present. No enlarged abdominal or pelvic lymph nodes. Reproductive: Small ill-defined heterogeneous uterine fibroids are suspected. Mild, nonspecific inflammatory fat stranding is seen within the region anterior to the right adnexa. This is seen on the prior study and is increased in severity on the current exam. The bilateral adnexa are otherwise unremarkable Other: No abdominal wall hernia or abnormality. No abdominopelvic ascites. Musculoskeletal: No acute or significant osseous findings. IMPRESSION: 1. Mild, nonspecific inflammatory fat stranding within the region anterior to the right adnexa. This is seen on the prior study and is increased in severity on the current exam. 2. Small ill-defined heterogeneous uterine fibroids. 3. Stable hepatic and left renal cysts. Electronically Signed    By: Suzen Dials M.D.   On: 10/24/2024 12:36     Procedures   Medications Ordered in the ED - No data to display  ED Course  Patient seen and examined. History obtained directly from patient.   Labs/EKG: Ordered CBC, CMP, UA, pregnancy.  Imaging: None ordered  Medications/Fluids: None ordered  Most recent vital signs reviewed and are as follows: BP (!) 145/104 (BP Location: Right Arm)   Pulse 73   Temp 97.6 F (36.4 C) (Oral)   Resp 16   LMP 10/11/2024 (Exact Date)   SpO2 100%   Initial impression: Right flank pain, involving right lower abdomen and suprapubic area last night.  Some irritative symptoms but no dysuria.  Clinically not suggestive of pyelonephritis.  Patient without focal right lower quadrant pain to suggest appendicitis.  //  Reassessment performed. Patient appears stable and comfortable.  Labs personally reviewed and interpreted including: CBC with normal  white blood cell count and hemoglobin; CMP was unremarkable; UA and pregnancy were negative.  Reviewed pertinent lab work and imaging with patient at bedside. Questions answered.  We discussed further evaluation with CT imaging versus treating symptoms and having close outpatient follow-up.  Patient would prefer to proceed with imaging.  Plan: CT abdomen pelvis.  1:04 PM Reassessment performed. Patient appears stable.  Imaging personally visualized and interpreted including: Uterine fibroids noted, patient already aware of these.  Patient does have some nonspecific inflammation in the right adnexal area which may be related to her symptoms.  This appears increased but also present in July 2024 CT.  Reviewed pertinent lab work and imaging with patient at bedside. Questions answered.  We did discuss proceeding with ultrasound today for further evaluation given unclear cause of the inflammation noted on CT, versus again outpatient follow-up.  Patient prefers to follow-up and is requesting  discharge.  Most current vital signs reviewed and are as follows: BP (!) 145/104 (BP Location: Right Arm)   Pulse 73   Temp 97.6 F (36.4 C) (Oral)   Resp 16   LMP 10/11/2024 (Exact Date)   SpO2 100%   Plan: Discharge to home.   Prescriptions written for: Naproxen   Other home care instructions discussed: Avoidance of activities which make symptoms worse.  ED return instructions discussed: The patient was urged to return to the Emergency Department immediately with worsening of current symptoms, worsening abdominal pain, persistent vomiting, blood noted in stools, fever, or any other concerns. The patient verbalized understanding.   Follow-up instructions discussed: Patient encouraged to follow-up with their PCP or OB/GYN in 7 days.                                     Medical Decision Making Amount and/or Complexity of Data Reviewed Labs: ordered. Radiology: ordered.  Risk Prescription drug management.   Patient presents with several days of right flank pain now more in the right pelvis area.  No vaginal symptoms.  Initially concern for UTI however urine was clear.  Lab workup is reassuring.  Given unexplained pain proceeded with CT imaging which shows some nonspecific inflammation in the right pelvis area.  Clinically symptoms are not concerning for ovarian torsion given lack of severe pain with vomiting.  No large cysts noted on CT imaging.  No signs of appendicitis.  Patient prefers to follow-up as outpatient for consideration of ultrasound.  Otherwise we will treat with anti-inflammatories and have patient avoid activities which make the symptoms worse.  For this patient's complaint of abdominal pain, the following conditions were considered on the differential diagnosis: gastritis/PUD, enteritis/duodenitis, appendicitis, cholelithiasis/cholecystitis, cholangitis, pancreatitis, ruptured viscus, colitis, diverticulitis, small/large bowel obstruction, proctitis, cystitis,  pyelonephritis, ureteral colic, aortic dissection, aortic aneurysm. In women, ectopic pregnancy, pelvic inflammatory disease, ovarian cysts, and tubo-ovarian abscess were also considered. Atypical chest etiologies were also considered including ACS, PE, and pneumonia.  The patient's vital signs, pertinent lab work and imaging were reviewed and interpreted as discussed in the ED course. Hospitalization was considered for further testing, treatments, or serial exams/observation. However as patient is well-appearing, has a stable exam, and reassuring studies today, I do not feel that they warrant admission at this time. This plan was discussed with the patient who verbalizes agreement and comfort with this plan and seems reliable and able to return to the Emergency Department with worsening or changing symptoms.  Final diagnoses:  Right flank pain    ED Discharge Orders          Ordered    naproxen  (NAPROSYN ) 500 MG tablet  2 times daily        10/24/24 1302               Desiderio Chew, PA-C 10/24/24 1307    Neysa Caron PARAS, DO 10/24/24 1537  "

## 2024-10-24 NOTE — ED Notes (Signed)
 Pt tried telemed and they told her to come in for UTI evaluation. Having polyuria, R flank pain with radiation down to inguinal-pelvic region. No trauma, no obvious hematuria, and denies any dysuria. Concerned for UTI, no known hx of stones.

## 2024-10-24 NOTE — ED Triage Notes (Signed)
 R flank pain, pelvic pain, increased urinary frequency, nausea for 4 days.   Denies hematuria or dysuria

## 2024-10-28 ENCOUNTER — Encounter: Payer: Self-pay | Admitting: Nurse Practitioner

## 2024-10-28 ENCOUNTER — Other Ambulatory Visit (HOSPITAL_BASED_OUTPATIENT_CLINIC_OR_DEPARTMENT_OTHER): Payer: Self-pay | Admitting: Family Medicine

## 2024-10-28 DIAGNOSIS — R9389 Abnormal findings on diagnostic imaging of other specified body structures: Secondary | ICD-10-CM

## 2024-10-28 DIAGNOSIS — R1031 Right lower quadrant pain: Secondary | ICD-10-CM

## 2024-11-01 ENCOUNTER — Ambulatory Visit (HOSPITAL_BASED_OUTPATIENT_CLINIC_OR_DEPARTMENT_OTHER)
Admission: RE | Admit: 2024-11-01 | Discharge: 2024-11-01 | Disposition: A | Discharge status: Home / Self Care | Source: Ambulatory Visit | Attending: Family Medicine | Admitting: Family Medicine

## 2024-11-01 DIAGNOSIS — R9389 Abnormal findings on diagnostic imaging of other specified body structures: Secondary | ICD-10-CM | POA: Insufficient documentation

## 2024-11-01 DIAGNOSIS — R1031 Right lower quadrant pain: Secondary | ICD-10-CM | POA: Diagnosis present
